# Patient Record
Sex: Female | Born: 1955 | Race: Black or African American | Hispanic: No | Marital: Married | Smoking: Never smoker
Health system: Southern US, Community
[De-identification: ages and names within clinical notes are randomized; demographics above are authoritative.]

## PROBLEM LIST (undated history)

## (undated) DIAGNOSIS — J45909 Unspecified asthma, uncomplicated: Secondary | ICD-10-CM

## (undated) DIAGNOSIS — T7840XA Allergy, unspecified, initial encounter: Secondary | ICD-10-CM

## (undated) DIAGNOSIS — E785 Hyperlipidemia, unspecified: Secondary | ICD-10-CM

## (undated) DIAGNOSIS — D649 Anemia, unspecified: Secondary | ICD-10-CM

## (undated) DIAGNOSIS — H269 Unspecified cataract: Secondary | ICD-10-CM

## (undated) DIAGNOSIS — L309 Dermatitis, unspecified: Secondary | ICD-10-CM

## (undated) DIAGNOSIS — Z889 Allergy status to unspecified drugs, medicaments and biological substances status: Secondary | ICD-10-CM

## (undated) DIAGNOSIS — B019 Varicella without complication: Secondary | ICD-10-CM

## (undated) DIAGNOSIS — T4145XA Adverse effect of unspecified anesthetic, initial encounter: Secondary | ICD-10-CM

## (undated) DIAGNOSIS — G4730 Sleep apnea, unspecified: Secondary | ICD-10-CM

## (undated) HISTORY — DX: Unspecified asthma, uncomplicated: J45.909

## (undated) HISTORY — PX: OTHER SURGICAL HISTORY: SHX169

## (undated) HISTORY — DX: Allergy, unspecified, initial encounter: T78.40XA

## (undated) HISTORY — DX: Varicella without complication: B01.9

## (undated) HISTORY — DX: Sleep apnea, unspecified: G47.30

## (undated) HISTORY — PX: COLONOSCOPY: SHX174

## (undated) HISTORY — DX: Adverse effect of unspecified anesthetic, initial encounter: T41.45XA

## (undated) HISTORY — DX: Hyperlipidemia, unspecified: E78.5

## (undated) HISTORY — PX: EYE SURGERY: SHX253

## (undated) SURGERY — MINOR CAPSULOTOMY
Anesthesia: Choice | Laterality: Left

---
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
## 1994-06-29 HISTORY — PX: MYOMECTOMY: SHX85

## 1998-06-29 DIAGNOSIS — T8859XA Other complications of anesthesia, initial encounter: Secondary | ICD-10-CM

## 1998-06-29 HISTORY — DX: Other complications of anesthesia, initial encounter: T88.59XA

## 2006-12-02 ENCOUNTER — Ambulatory Visit (HOSPITAL_COMMUNITY): Admission: RE | Admit: 2006-12-02 | Discharge: 2006-12-02 | Payer: Self-pay | Admitting: Obstetrics and Gynecology

## 2007-12-05 ENCOUNTER — Ambulatory Visit (HOSPITAL_COMMUNITY): Admission: RE | Admit: 2007-12-05 | Discharge: 2007-12-05 | Payer: Self-pay | Admitting: Obstetrics and Gynecology

## 2008-12-05 ENCOUNTER — Ambulatory Visit (HOSPITAL_COMMUNITY): Admission: RE | Admit: 2008-12-05 | Discharge: 2008-12-05 | Payer: Self-pay | Admitting: Obstetrics and Gynecology

## 2010-01-06 ENCOUNTER — Ambulatory Visit (HOSPITAL_COMMUNITY): Admission: RE | Admit: 2010-01-06 | Discharge: 2010-01-06 | Payer: Self-pay | Admitting: Obstetrics and Gynecology

## 2010-11-25 ENCOUNTER — Other Ambulatory Visit: Payer: Self-pay | Admitting: Nurse Practitioner

## 2010-11-25 ENCOUNTER — Ambulatory Visit
Admission: RE | Admit: 2010-11-25 | Discharge: 2010-11-25 | Disposition: A | Discharge status: Home / Self Care | Payer: BC Managed Care – PPO | Source: Ambulatory Visit | Attending: Nurse Practitioner | Admitting: Nurse Practitioner

## 2010-11-25 DIAGNOSIS — R05 Cough: Secondary | ICD-10-CM

## 2010-12-08 ENCOUNTER — Other Ambulatory Visit (HOSPITAL_COMMUNITY): Payer: Self-pay | Admitting: Obstetrics and Gynecology

## 2010-12-08 DIAGNOSIS — Z1231 Encounter for screening mammogram for malignant neoplasm of breast: Secondary | ICD-10-CM

## 2011-01-08 ENCOUNTER — Ambulatory Visit (HOSPITAL_COMMUNITY)
Admission: RE | Admit: 2011-01-08 | Discharge: 2011-01-08 | Disposition: A | Discharge status: Home / Self Care | Payer: BC Managed Care – PPO | Source: Ambulatory Visit | Attending: Obstetrics and Gynecology | Admitting: Obstetrics and Gynecology

## 2011-01-08 DIAGNOSIS — Z1231 Encounter for screening mammogram for malignant neoplasm of breast: Secondary | ICD-10-CM

## 2011-09-07 DIAGNOSIS — L659 Nonscarring hair loss, unspecified: Secondary | ICD-10-CM | POA: Insufficient documentation

## 2011-09-07 DIAGNOSIS — N951 Menopausal and female climacteric states: Secondary | ICD-10-CM | POA: Insufficient documentation

## 2011-10-06 ENCOUNTER — Ambulatory Visit: Payer: Self-pay | Admitting: Obstetrics and Gynecology

## 2011-10-20 ENCOUNTER — Encounter: Payer: Self-pay | Admitting: Obstetrics and Gynecology

## 2011-10-20 ENCOUNTER — Ambulatory Visit (INDEPENDENT_AMBULATORY_CARE_PROVIDER_SITE_OTHER): Payer: BC Managed Care – PPO | Admitting: Obstetrics and Gynecology

## 2011-10-20 VITALS — BP 118/68 | Resp 16 | Ht 63.75 in | Wt 158.0 lb

## 2011-10-20 DIAGNOSIS — Z01419 Encounter for gynecological examination (general) (routine) without abnormal findings: Secondary | ICD-10-CM

## 2011-10-20 DIAGNOSIS — Z124 Encounter for screening for malignant neoplasm of cervix: Secondary | ICD-10-CM

## 2011-10-20 NOTE — Progress Notes (Signed)
Addended by: Marla Roe A on: 10/20/2011 04:49 PM   Modules accepted: Orders

## 2011-10-20 NOTE — Progress Notes (Addendum)
Contraception none/ PM Last pap 09/2010 wnl Last Mammo 12/2010 wnl Last Colonoscopy 2009 Last Dexa Scan ? If ever had Primary MD Dr. Allyne Gee Abuse at Home none LMP 09/20/2010 P2G1 Melody Comas A  No complaints    Filed Vitals:   10/20/11 1525  BP: 118/68  Resp: 16   Physical Examination: General appearance - alert, well appearing, and in no distress Mental status - alert, oriented to person, place, and time Chest - clear to auscultation, no wheezes, rales or rhonchi, symmetric air entry Heart - normal rate and regular rhythm Abdomen - soft, nontender, nondistended, no masses or organomegaly Breasts - breasts appear normal, no suspicious masses, no skin or nipple changes or axillary nodes Pelvic - normal external genitalia, vulva, vagina, cervix, uterus and adnexa except slightly stenotic (dilated for pap)  A/P Nl Gyn Exam mammo 12/2011

## 2011-10-21 LAB — PAP IG W/ RFLX HPV ASCU

## 2011-11-02 ENCOUNTER — Other Ambulatory Visit: Payer: Self-pay | Admitting: Ophthalmology

## 2011-11-02 NOTE — H&P (View-Only) (Signed)
  Pre-operative History and Physical for Ophthalmic Surgery  Jerica Harvey-Akan 11/02/2011                  Chief Complaint: Decreased vision  Diagnosis: Cataract Left Eye  Allergies  Allergen Reactions  . Darvocet (Propoxyphene-Acetaminophen)   . Hydrocodone   . Penicillins     Prior to Admission medications   Medication Sig Start Date End Date Taking? Authorizing Provider  b complex vitamins capsule Take 1 capsule by mouth daily.    Historical Provider, MD  calcium-vitamin D (OSCAL WITH D) 500-200 MG-UNIT per tablet Take 1 tablet by mouth daily.    Historical Provider, MD    Planned Procedure:                                       Phacoemulsification, Posterior Chamber Intra-ocular Lens Left Eye                                      Acrysof Extend Pc IOL +   There were no vitals filed for this visit.  Pulse: 70         Temp: NE        Resp:  16     ROS:  non-contributory  Past Medical History  Diagnosis Date  . Lupus   . Chicken pox   . Measles   . Mumps   . Low iron   . H/O bladder infections   . Complication of anesthesia 2000    c-section; excessive shivering    Past Surgical History  Procedure Date  . Myomectomy 1998  . Tonsillectomy   . Wisdom tooth extraction   . Adenodectomy   . Cesarean section 2000     History   Social History  . Marital Status: Married    Spouse Name: N/A    Number of Children: N/A  . Years of Education: N/A   Occupational History  . Not on file.   Social History Main Topics  . Smoking status: Never Smoker   . Smokeless tobacco: Not on file  . Alcohol Use: Yes  . Drug Use: No  . Sexually Active: Yes    Birth Control/ Protection: Pill   Other Topics Concern  . Not on file   Social History Narrative  . No narrative on file    The following examination is for anesthesia clearance for minimally invasive Ophthalmic surgery. It is primarily to document heart and lung findings and is not intended to elucidate unknown  general medical conditions inclusive of abdominal masses, lung lesions, etc.   General Constitution:  Within Normal Limits   Alertness/Orientation:  Person, time place     yes   HEENT:  Eye Findings: Cataract                   left eye  Neck: supple without masses  Chest/Lungs: Normal S1 and S2 without Murmur, S3 or S4  Cardiac: Normal S1 and S2 without Murmur, S3 or S4  Neuro: non-focal  Impression: Cataract Left Eye  Planned Procedure: Phacoemulsification, Posterior Chamber Intraocular Lens Left Eye    Quay Simkin, MD        

## 2011-11-02 NOTE — H&P (Addendum)
  Pre-operative History and Physical for Ophthalmic Surgery  Maria Kline 11/02/2011                  Chief Complaint: Decreased vision  Diagnosis: Cataract Left Eye  Allergies  Allergen Reactions  . Darvocet (Propoxyphene-Acetaminophen)   . Hydrocodone   . Penicillins     Prior to Admission medications   Medication Sig Start Date End Date Taking? Authorizing Provider  b complex vitamins capsule Take 1 capsule by mouth daily.    Historical Provider, MD  calcium-vitamin D (OSCAL WITH D) 500-200 MG-UNIT per tablet Take 1 tablet by mouth daily.    Historical Provider, MD    Planned Procedure:                                       Phacoemulsification, Posterior Chamber Intra-ocular Lens Left Eye                                      Acrysof Extend Pc IOL +   There were no vitals filed for this visit.  Pulse: 70         Temp: NE        Resp:  16     ROS:  non-contributory  Past Medical History  Diagnosis Date  . Lupus   . Chicken pox   . Measles   . Mumps   . Low iron   . H/O bladder infections   . Complication of anesthesia 2000    c-section; excessive shivering    Past Surgical History  Procedure Date  . Myomectomy 1998  . Tonsillectomy   . Wisdom tooth extraction   . Adenodectomy   . Cesarean section 2000     History   Social History  . Marital Status: Married    Spouse Name: N/A    Number of Children: N/A  . Years of Education: N/A   Occupational History  . Not on file.   Social History Main Topics  . Smoking status: Never Smoker   . Smokeless tobacco: Not on file  . Alcohol Use: Yes  . Drug Use: No  . Sexually Active: Yes    Birth Control/ Protection: Pill   Other Topics Concern  . Not on file   Social History Narrative  . No narrative on file    The following examination is for anesthesia clearance for minimally invasive Ophthalmic surgery. It is primarily to document heart and lung findings and is not intended to elucidate unknown  general medical conditions inclusive of abdominal masses, lung lesions, etc.   General Constitution:  Within Normal Limits   Alertness/Orientation:  Person, time place     yes   HEENT:  Eye Findings: Cataract                   left eye  Neck: supple without masses  Chest/Lungs: Normal S1 and S2 without Murmur, S3 or S4  Cardiac: Normal S1 and S2 without Murmur, S3 or S4  Neuro: non-focal  Impression: Cataract Left Eye  Planned Procedure: Phacoemulsification, Posterior Chamber Intraocular Lens Left Eye    Shade Flood, MD

## 2011-11-02 NOTE — Interval H&P Note (Signed)
Acrysof Extend  + 3.50  Diopter PC IOL for implant OS  Jenica Costilow

## 2011-11-10 ENCOUNTER — Encounter (HOSPITAL_COMMUNITY): Payer: Self-pay | Admitting: Pharmacy Technician

## 2011-11-12 ENCOUNTER — Encounter (HOSPITAL_COMMUNITY): Payer: Self-pay

## 2011-11-12 ENCOUNTER — Encounter (HOSPITAL_COMMUNITY)
Admission: RE | Admit: 2011-11-12 | Discharge: 2011-11-12 | Disposition: A | Discharge status: Home / Self Care | Payer: BC Managed Care – PPO | Source: Ambulatory Visit | Attending: Ophthalmology | Admitting: Ophthalmology

## 2011-11-12 HISTORY — DX: Anemia, unspecified: D64.9

## 2011-11-12 HISTORY — DX: Unspecified cataract: H26.9

## 2011-11-12 HISTORY — DX: Allergy status to unspecified drugs, medicaments and biological substances: Z88.9

## 2011-11-12 HISTORY — DX: Dermatitis, unspecified: L30.9

## 2011-11-12 LAB — CBC
HCT: 38.7 % (ref 36.0–46.0)
Hemoglobin: 12.9 g/dL (ref 12.0–15.0)
MCV: 88.8 fL (ref 78.0–100.0)

## 2011-11-12 NOTE — Pre-Procedure Instructions (Signed)
20 Maria Kline  11/12/2011   Your procedure is scheduled on:  Wed, May 29 @ 1:10 PM  Report to [facility] at 11:00 AM.  Call this number if you have problems the morning of surgery: [phone]   Remember:   Do not eat food:After Midnight.  May have clear liquids: up to 4 Hours before arrival(until 7:00 AM)  Clear liquids include soda, tea, black coffee, apple or grape juice, broth,water   Do not wear jewelry, make-up or nail polish.  Do not wear lotions, powders, or perfumes.   Do not shave 48 hours prior to surgery.   Do not bring valuables to the hospital.  Contacts, dentures or bridgework may not be worn into surgery.  Leave suitcase in the car. After surgery it may be brought to your room.  For patients admitted to the hospital, checkout time is 11:00 AM the day of discharge.   Patients discharged the day of surgery will not be allowed to drive home.  Special Instructions: CHG Shower Use Special Wash: 1/2 bottle night before surgery and 1/2 bottle morning of surgery.   Please read over the following fact sheets that you were given: Pain Booklet, Coughing and Deep Breathing and Surgical Site Infection Prevention

## 2011-11-12 NOTE — Progress Notes (Signed)
Pt doesn't have a cardiologist  Denies ever having an echo/stress test/heart cath  Medical MD is Dr.Robin Allyne Gee with [facility]

## 2011-11-24 MED ORDER — PREDNISOLONE ACETATE 1 % OP SUSP
1.0000 [drp] | OPHTHALMIC | Status: AC
Start: 2011-11-25 — End: 2011-11-25
  Administered 2011-11-25: 1 [drp] via OPHTHALMIC
  Filled 2011-11-24: qty 5

## 2011-11-24 MED ORDER — TETRACAINE HCL 0.5 % OP SOLN
2.0000 [drp] | OPHTHALMIC | Status: AC
  Administered 2011-11-25: 2 [drp] via OPHTHALMIC
  Filled 2011-11-24: qty 2

## 2011-11-24 MED ORDER — GATIFLOXACIN 0.5 % OP SOLN
1.0000 [drp] | OPHTHALMIC | Status: AC | PRN
  Administered 2011-11-25 (×3): 1 [drp] via OPHTHALMIC
  Filled 2011-11-24: qty 2.5

## 2011-11-24 MED ORDER — PHENYLEPHRINE HCL 2.5 % OP SOLN
1.0000 [drp] | OPHTHALMIC | Status: AC | PRN
  Administered 2011-11-25 (×3): 1 [drp] via OPHTHALMIC
  Filled 2011-11-24: qty 3

## 2011-11-25 ENCOUNTER — Encounter (HOSPITAL_COMMUNITY): Payer: Self-pay | Admitting: Anesthesiology

## 2011-11-25 ENCOUNTER — Encounter (HOSPITAL_COMMUNITY)
Admission: RE | Disposition: A | Discharge status: Home / Self Care | Payer: Self-pay | Source: Ambulatory Visit | Attending: Ophthalmology

## 2011-11-25 ENCOUNTER — Ambulatory Visit (HOSPITAL_COMMUNITY)
Admission: RE | Admit: 2011-11-25 | Discharge: 2011-11-25 | Disposition: A | Discharge status: Home / Self Care | Payer: BC Managed Care – PPO | Source: Ambulatory Visit | Attending: Ophthalmology | Admitting: Ophthalmology

## 2011-11-25 ENCOUNTER — Ambulatory Visit (HOSPITAL_COMMUNITY): Payer: BC Managed Care – PPO | Admitting: Anesthesiology

## 2011-11-25 ENCOUNTER — Encounter (HOSPITAL_COMMUNITY): Payer: Self-pay

## 2011-11-25 DIAGNOSIS — H269 Unspecified cataract: Secondary | ICD-10-CM | POA: Insufficient documentation

## 2011-11-25 DIAGNOSIS — Z01812 Encounter for preprocedural laboratory examination: Secondary | ICD-10-CM | POA: Insufficient documentation

## 2011-11-25 HISTORY — PX: CATARACT EXTRACTION W/PHACO: SHX586

## 2011-11-25 SURGERY — PHACOEMULSIFICATION, CATARACT, WITH IOL INSERTION
Anesthesia: Monitor Anesthesia Care | Site: Eye | Laterality: Left | Wound class: Clean

## 2011-11-25 MED ORDER — WATER FOR IRRIGATION, STERILE IR SOLN
Status: DC | PRN
  Administered 2011-11-25: 1000 mL via OPHTHALMIC

## 2011-11-25 MED ORDER — SODIUM CHLORIDE 0.9 % IV SOLN
INTRAVENOUS | Status: DC | PRN
  Administered 2011-11-25: 13:00:00 via INTRAVENOUS

## 2011-11-25 MED ORDER — DEXAMETHASONE SODIUM PHOSPHATE 10 MG/ML IJ SOLN
INTRAMUSCULAR | Status: DC | PRN
  Administered 2011-11-25: 10 mg

## 2011-11-25 MED ORDER — BUPIVACAINE HCL 0.75 % IJ SOLN
INTRAMUSCULAR | Status: DC | PRN
  Administered 2011-11-25: 10 mL

## 2011-11-25 MED ORDER — EPINEPHRINE HCL 1 MG/ML IJ SOLN
INTRAOCULAR | Status: DC | PRN
  Administered 2011-11-25: 13:00:00

## 2011-11-25 MED ORDER — CEFAZOLIN SUBCONJUNCTIVAL INJECTION 100 MG/0.5 ML
200.0000 mg | INJECTION | SUBCONJUNCTIVAL | Status: DC
  Filled 2011-11-25: qty 1

## 2011-11-25 MED ORDER — EPINEPHRINE HCL 1 MG/ML IJ SOLN: INTRAOCULAR | Status: DC | PRN

## 2011-11-25 MED ORDER — BACITRACIN-POLYMYXIN B 500-10000 UNIT/GM OP OINT
TOPICAL_OINTMENT | OPHTHALMIC | Status: DC | PRN
  Administered 2011-11-25: 1 via OPHTHALMIC

## 2011-11-25 MED ORDER — ONDANSETRON HCL 4 MG/2ML IJ SOLN
INTRAMUSCULAR | Status: DC | PRN
  Administered 2011-11-25: 4 mg via INTRAVENOUS

## 2011-11-25 MED ORDER — PROVISC 10 MG/ML IO SOLN
INTRAOCULAR | Status: DC | PRN
  Administered 2011-11-25: .85 mL via INTRAOCULAR

## 2011-11-25 MED ORDER — NA CHONDROIT SULF-NA HYALURON 40-30 MG/ML IO SOLN
INTRAOCULAR | Status: DC | PRN
  Administered 2011-11-25: 0.5 mL via INTRAOCULAR

## 2011-11-25 MED ORDER — LIDOCAINE HCL (PF) 2 % IJ SOLN
INTRAMUSCULAR | Status: DC | PRN
  Administered 2011-11-25: 10 mL

## 2011-11-25 MED ORDER — PROPOFOL 10 MG/ML IV EMUL
INTRAVENOUS | Status: DC | PRN
  Administered 2011-11-25: 30 mg via INTRAVENOUS

## 2011-11-25 MED ORDER — MIDAZOLAM HCL 5 MG/5ML IJ SOLN
INTRAMUSCULAR | Status: DC | PRN
  Administered 2011-11-25 (×2): 1 mg via INTRAVENOUS

## 2011-11-25 MED ORDER — HYPROMELLOSE (GONIOSCOPIC) 2.5 % OP SOLN
OPHTHALMIC | Status: DC | PRN
  Administered 2011-11-25: 1 [drp] via OPHTHALMIC

## 2011-11-25 MED ORDER — FENTANYL CITRATE 0.05 MG/ML IJ SOLN
INTRAMUSCULAR | Status: DC | PRN
  Administered 2011-11-25: 50 ug via INTRAVENOUS

## 2011-11-25 MED ORDER — LIDOCAINE HCL (CARDIAC) 10 MG/ML IV SOLN
INTRAVENOUS | Status: DC | PRN
  Administered 2011-11-25: 50 mg via INTRAVENOUS

## 2011-11-25 MED ORDER — VANCOMYCIN SUBCONJUNCTIVAL INJECTION 25 MG/0.5 ML
50.0000 mg | INTRAOCULAR | Status: AC
  Administered 2011-11-25: 50 mg via SUBCONJUNCTIVAL
  Filled 2011-11-25: qty 1

## 2011-11-25 SURGICAL SUPPLY — 56 items
APPLICATOR COTTON TIP 6IN STRL (MISCELLANEOUS) ×2 IMPLANT
APPLICATOR DR MATTHEWS STRL (MISCELLANEOUS) ×2 IMPLANT
BAG MINI COLL DRAIN (WOUND CARE) ×2 IMPLANT
BLADE EYE MINI 60D BEAVER (BLADE) IMPLANT
BLADE KERATOME 2.75 (BLADE) ×2 IMPLANT
BLADE STAB KNIFE 15DEG (BLADE) IMPLANT
CANNULA ANTERIOR CHAMBER 27GA (MISCELLANEOUS) IMPLANT
CLOTH BEACON ORANGE TIMEOUT ST (SAFETY) ×2 IMPLANT
DRAPE OPHTHALMIC 77X100 STRL (CUSTOM PROCEDURE TRAY) ×2 IMPLANT
DRAPE POUCH INSTRU U-SHP 10X18 (DRAPES) ×2 IMPLANT
DRSG TEGADERM 4X4.75 (GAUZE/BANDAGES/DRESSINGS) ×2 IMPLANT
FILTER BLUE MILLIPORE (MISCELLANEOUS) IMPLANT
GLOVE SS BIOGEL STRL SZ 6.5 (GLOVE) ×1 IMPLANT
GLOVE SUPERSENSE BIOGEL SZ 6.5 (GLOVE) ×1
GOWN SRG XL XLNG 56XLVL 4 (GOWN DISPOSABLE) IMPLANT
GOWN STRL NON-REIN LRG LVL3 (GOWN DISPOSABLE) IMPLANT
GOWN STRL NON-REIN XL XLG LVL4 (GOWN DISPOSABLE)
Intraocular Lens ×2 IMPLANT
KIT BASIN OR (CUSTOM PROCEDURE TRAY) ×2 IMPLANT
KIT ROOM TURNOVER OR (KITS) IMPLANT
KNIFE GRIESHABER SHARP 2.5MM (MISCELLANEOUS) ×2 IMPLANT
MASK EYE SHIELD (GAUZE/BANDAGES/DRESSINGS) ×2 IMPLANT
NEEDLE 18GX1X1/2 (RX/OR ONLY) (NEEDLE) IMPLANT
NEEDLE 22X1 1/2 (OR ONLY) (NEEDLE) ×2 IMPLANT
NEEDLE 25GX 5/8IN NON SAFETY (NEEDLE) ×2 IMPLANT
NEEDLE FILTER BLUNT 18X 1/2SAF (NEEDLE)
NEEDLE FILTER BLUNT 18X1 1/2 (NEEDLE) IMPLANT
NEEDLE HYPO 30X.5 LL (NEEDLE) ×4 IMPLANT
NS IRRIG 1000ML POUR BTL (IV SOLUTION) ×2 IMPLANT
PACK CATARACT CUSTOM (CUSTOM PROCEDURE TRAY) ×2 IMPLANT
PACK CATARACT MCHSCP (PACKS) ×2 IMPLANT
PACK COMBINED CATERACT/VIT 23G (OPHTHALMIC RELATED) IMPLANT
PAD ARMBOARD 7.5X6 YLW CONV (MISCELLANEOUS) ×2 IMPLANT
PAD EYE OVAL STERILE LF (GAUZE/BANDAGES/DRESSINGS) ×2 IMPLANT
PHACO TIP KELMAN 45DEG (TIP) ×2 IMPLANT
PROBE ANTERIOR 20G W/INFUS NDL (MISCELLANEOUS) IMPLANT
ROLLS DENTAL (MISCELLANEOUS) IMPLANT
SHUTTLE MONARCH TYPE A (NEEDLE) ×2 IMPLANT
SOLUTION ANTI FOG 6CC (MISCELLANEOUS) ×2 IMPLANT
SPEAR EYE SURG WECK-CEL (MISCELLANEOUS) ×2 IMPLANT
SUT ETHILON 10-0 CS-B-6CS-B-6 (SUTURE)
SUT ETHILON 5 0 P 3 18 (SUTURE)
SUT ETHILON 9 0 TG140 8 (SUTURE) IMPLANT
SUT NYLON ETHILON 5-0 P-3 1X18 (SUTURE) IMPLANT
SUT PLAIN 6 0 TG1408 (SUTURE) IMPLANT
SUT POLY NON ABSORB 10-0 8 STR (SUTURE) IMPLANT
SUT VICRYL 6 0 S 29 12 (SUTURE) IMPLANT
SUTURE EHLN 10-0 CS-B-6CS-B-6 (SUTURE) IMPLANT
SYR 20CC LL (SYRINGE) IMPLANT
SYR 5ML LL (SYRINGE) IMPLANT
SYR TB 1ML LUER SLIP (SYRINGE) ×2 IMPLANT
SYRINGE 10CC LL (SYRINGE) IMPLANT
TAPE PAPER MEDFIX 1IN X 10YD (GAUZE/BANDAGES/DRESSINGS) ×2 IMPLANT
TOWEL OR 17X24 6PK STRL BLUE (TOWEL DISPOSABLE) ×4 IMPLANT
WATER STERILE IRR 1000ML POUR (IV SOLUTION) ×2 IMPLANT
WIPE INSTRUMENT VISIWIPE 73X73 (MISCELLANEOUS) ×2 IMPLANT

## 2011-11-25 NOTE — Preoperative (Signed)
Beta Blockers   Reason not to administer Beta Blockers:Not Applicable 

## 2011-11-25 NOTE — Anesthesia Procedure Notes (Signed)
Procedure Name: MAC Date/Time: 11/25/2011 1:19 PM Performed by: Lovie Chol Pre-anesthesia Checklist: Patient identified, Emergency Drugs available, Suction available, Patient being monitored and Timeout performed Patient Re-evaluated:Patient Re-evaluated prior to inductionOxygen Delivery Method: Nasal cannula Preoxygenation: Pre-oxygenation with 100% oxygen Intubation Type: IV induction Placement Confirmation: positive ETCO2

## 2011-11-25 NOTE — Interval H&P Note (Signed)
History and Physical Interval Note:  11/25/2011 12:36 PM  Maria Kline  has presented today for surgery, with the diagnosis of Nuclear Cataract  The various methods of treatment have been discussed with the patient and family. After consideration of risks, benefits and other options for treatment, the patient has consented to  Procedure(s) (LRB): CATARACT EXTRACTION PHACO AND INTRAOCULAR LENS PLACEMENT (IOC) (Left) as a surgical intervention .  The patients' history has been reviewed, patient examined, no change in status, stable for surgery.  I have reviewed the patients' chart and labs.  Questions were answered to the patient's satisfaction.     Shade Flood, MD

## 2011-11-25 NOTE — Anesthesia Preprocedure Evaluation (Addendum)
Anesthesia Evaluation  Patient identified by MRN, date of birth, ID band Patient awake    Reviewed: Allergy & Precautions, H&P , NPO status , Patient's Chart, lab work & pertinent test results  History of Anesthesia Complications Negative for: history of anesthetic complications  Airway Mallampati: II TM Distance: >3 FB Neck ROM: Full    Dental  (+) Teeth Intact, Caps and Dental Advisory Given   Pulmonary  breath sounds clear to auscultation        Cardiovascular Rhythm:Regular Rate:Normal     Neuro/Psych    GI/Hepatic   Endo/Other    Renal/GU      Musculoskeletal   Abdominal   Peds  Hematology   Anesthesia Other Findings   Reproductive/Obstetrics                          Anesthesia Physical Anesthesia Plan  ASA: II  Anesthesia Plan: MAC   Post-op Pain Management:    Induction: Intravenous  Airway Management Planned: Nasal Cannula  Additional Equipment:   Intra-op Plan:   Post-operative Plan:   Informed Consent: I have reviewed the patients History and Physical, chart, labs and discussed the procedure including the risks, benefits and alternatives for the proposed anesthesia with the patient or authorized representative who has indicated his/her understanding and acceptance.   Dental advisory given  Plan Discussed with:   Anesthesia Plan Comments: (Plan MAC  Kipp Brood, MD)        Anesthesia Quick Evaluation

## 2011-11-25 NOTE — Transfer of Care (Signed)
Immediate Anesthesia Transfer of Care Note  Patient: Maria Kline  Procedure(s) Performed: Procedure(s) (LRB): CATARACT EXTRACTION PHACO AND INTRAOCULAR LENS PLACEMENT (IOC) (Left)  Patient Location: Short Stay  Anesthesia Type: MAC  Level of Consciousness: awake, alert  and oriented  Airway & Oxygen Therapy: Patient Spontanous Breathing  Post-op Assessment: Report given to PACU RN  Post vital signs: Reviewed  Complications: No apparent anesthesia complications

## 2011-11-25 NOTE — Discharge Instructions (Signed)
Zaelyn Noack      11/25/2011  Post-operative instructions for Sagar Tengan L. Clarisa Kindred, MD  Caring for your eye:  Do not rub your eye and wash your hands before touching the eye area. This is important to avoid injury and infection.  You may use sterile gauze pads and sterile eye wash to cleanse the lid margins of mucous accumulation.  DO NOT REUSE GAUZE after wiping the eye. Use a new clean one if needed.  Be certain not to touch the top of the medication bottle to the eyelids to avoid contaminating your medicine bottle and causing infection.  After eye surgery the surface of the eye and eyelids may be puffy. You may note a red blotch(s) on the surface of the eye or a bruise on your eyelid. These are usually related to injections or instruments used in surgery and are not cause for alarm. You may also notice blood tinged tears on your eye pad, this is common and not cause for alarm. These findings will subside over the coming week or two.  Activity:  No jarring activities. Walking with assistance early on as needed is advised. Avoid straining and let me know if you have significant constipation. Do not bend over at the waist with the head below your waist to minimize risk of bleeding inside the eye.  Avoid getting water from washing your hair or showering  in your eye. Patch the eye if necessary during bathing to avoid contamination.    You may: watch television, work on you computer, read books, eat out, ride in a car.  Sleeping Position:    You do not have a gas bubble in your eye, you may sleep in your         customary manner.  Wear your eye shield for naps and sleeping at night for the first two weeks.   Wait a few minutes between your eye drops when placing them.   Resume your customary medications on your normal schedule. Shade Flood MD    After office hours I can be reached by calling: [phone]                                                                     or    [phone]

## 2011-11-25 NOTE — Anesthesia Postprocedure Evaluation (Signed)
  Anesthesia Post-op Note  Patient: Maria Kline  Procedure(s) Performed: Procedure(s) (LRB): CATARACT EXTRACTION PHACO AND INTRAOCULAR LENS PLACEMENT (IOC) (Left)  Patient Location: Short Stay  Anesthesia Type: MAC  Level of Consciousness: awake  Airway and Oxygen Therapy: Patient Spontanous Breathing  Post-op Pain: none  Post-op Assessment: Post-op Vital signs reviewed, Patient's Cardiovascular Status Stable, Respiratory Function Stable, Patent Airway and No signs of Nausea or vomiting  Post-op Vital Signs: Reviewed  Complications: No apparent anesthesia complications

## 2011-11-25 NOTE — Op Note (Signed)
Maria Kline 11/25/2011 Cataract  Procedure: Phacoemulsification, Posterior Chamber Intra-ocular Lens Operative Eye:  Left Eye Surgeon: Shade Flood Estimated Blood Loss: minimal Specimens for Pathology:  None Complications: none  The patient was prepared and draped in the usual manner for ocular surgery on the left eye. A Cook lid speculum was placed. A peripheral clear corneal incision was made at the surgical limbus centered at the 11:00 meridian. A separate clear corneal stab incision was made with a 15 degree blade at the 2:00 meridian to permit bi-manual technique. Provisc was instilled into the anterior chamber through that incision.  A keratome was used to create a self sealing incision entering the anterior chamber at the 11:00 meridian. A capsulorhexis was performed using a bent 25g needle. The lens was hydrodissected and the nucleus was hydrodilineated using a Nichammin cannula. The Chang chopper was inserted and used to rotate the lens to insure adequate lens mobility. The phacoemulsification handpiece was inserted and a combined phaco-chop technique was employed, fracturing the lens into separate sections with subsequent removal with the phaco handpiece.   The I/A cannula was used to remove remaining lens cortex. Provisc was instilled and used to deepen the anterior chamber and posterior capsule bag. The Monarch injector was used to place a folded Acrysof Expand PC IOL, + 4.00  diopters, into the capsule bag. A Sinskey lens hook was used to dial in the trailing haptic.  The I/A cannula was used to remove the viscoelastic from the anterior chamber. BSS was used to bring IOP to the desired range and the wound was checked to insure it was watertight. Subconjunctival injections of Ance 100/0.79ml and Dexamethasone 4mg /35ml were placed without complication. The lid speculum and drapes were removed and the patient's eye was patched with Polymixin/Bacitracin ophthalmic ointment. An eye shield  was placed and the patient was transferred alert and conversant from the operating room to the post-operative recovery area.   Shade Flood, MD

## 2011-12-01 ENCOUNTER — Encounter (HOSPITAL_COMMUNITY): Payer: Self-pay | Admitting: Ophthalmology

## 2011-12-02 ENCOUNTER — Other Ambulatory Visit: Payer: Self-pay | Admitting: Obstetrics and Gynecology

## 2011-12-02 DIAGNOSIS — Z1231 Encounter for screening mammogram for malignant neoplasm of breast: Secondary | ICD-10-CM

## 2011-12-03 ENCOUNTER — Other Ambulatory Visit: Payer: Self-pay | Admitting: Ophthalmology

## 2011-12-03 NOTE — H&P (Signed)
Pre-operative History and Physical for Ophthalmic Surgery  Maria Kline 12/03/2011                  Chief Complaint: Decreased vision  Diagnosis: Cataract Right Eye  Allergies  Allergen Reactions  . Darvocet (Propoxyphene-Acetaminophen)     Patient states that she is not allergic to this medication  . Demerol (Meperidine) Itching  . Hydrocodone     Patient states that she is not allergic to this medication  . Penicillins Other (See Comments)    Childhood reaction     Prior to Admission medications   Medication Sig Start Date End Date Taking? Authorizing Provider  b complex vitamins capsule Take 1 capsule by mouth daily.    Historical Provider, MD  beta carotene w/minerals (OCUVITE) tablet Take 1 tablet by mouth daily.    Historical Provider, MD  lactobacillus acidophilus (BACID) TABS Take 2 tablets by mouth 3 (three) times daily.    Historical Provider, MD  Polyethyl Glycol-Propyl Glycol (SYSTANE) 0.4-0.3 % GEL Place 1 application into both eyes 4 (four) times daily as needed. For dry eyes    Historical Provider, MD  Vitamin D, Ergocalciferol, (DRISDOL) 50000 UNITS CAPS Take 50,000 Units by mouth 2 (two) times a week. Tuesdays and fridays    Historical Provider, MD    Planned Procedure:                                       Phacoemulsification, Posterior Chamber Intra-ocular Lens Right Eye                                      Acrysof Expand Series PC IOL  +3.00 for implant OD  There were no vitals filed for this visit.  Pulse: 70         Temp: NE        Resp:  16      ROS: negative  Past Medical History  Diagnosis Date  . Chicken pox   . Measles   . Mumps   . Complication of anesthesia 2000    c-section; excessive shivering  . Hx of seasonal allergies   . Vitamin d deficiency     Vit D on tues and fri  . Eczema     hx of  . Anemia     30yrs ago  . Cataracts, bilateral     Past Surgical History  Procedure Date  . Myomectomy 1996  . Wisdom tooth  extraction   . Adenodectomy   . Cesarean section 2000  . Tonsillectomy     as a child  . Colonoscopy   . Cataract extraction w/phaco 11/25/2011    Procedure: CATARACT EXTRACTION PHACO AND INTRAOCULAR LENS PLACEMENT (IOC);  Surgeon: Denita Lun, MD;  Location: [facility] OR;  Service: Ophthalmology;  Laterality: Left;     History   Social History  . Marital Status: Married    Spouse Name: N/A    Number of Children: N/A  . Years of Education: N/A   Occupational History  . Not on file.   Social History Main Topics  . Smoking status: Never Smoker   . Smokeless tobacco: Not on file  . Alcohol Use: Yes     socially  . Drug Use: No  . Sexually Active: Yes    Birth Control/ Protection:   Pill   Other Topics Concern  . Not on file   Social History Narrative  . No narrative on file     The following examination is for anesthesia clearance for minimally invasive Ophthalmic surgery. It is primarily to document heart and lung findings and is not intended to elucidate unknown general medical conditions inclusive of abdominal masses, lung lesions, etc.   General Constitution:  Within Normal Limits   Alertness/Orientation:  Person, time place     yes   HEENT:  Eye Findings: Cataract                   right eye  Neck: supple without masses  Chest/Lungs: clear to auscultation  Cardiac: Normal S1 and S2 without Murmur, S3 or S4  Neuro: non-focal   Impression: Cataract Right Eye  Planned Procedure:  Phacoemulsification, Posterior Chamber Intraocular Lens     Kaleb Sek, MD        

## 2011-12-04 ENCOUNTER — Encounter (HOSPITAL_COMMUNITY): Payer: Self-pay | Admitting: Pharmacy Technician

## 2011-12-04 ENCOUNTER — Encounter (HOSPITAL_COMMUNITY): Payer: Self-pay | Admitting: *Deleted

## 2011-12-07 ENCOUNTER — Encounter (HOSPITAL_COMMUNITY): Payer: Self-pay | Admitting: Anesthesiology

## 2011-12-07 ENCOUNTER — Ambulatory Visit (HOSPITAL_COMMUNITY): Payer: BC Managed Care – PPO | Admitting: Anesthesiology

## 2011-12-07 ENCOUNTER — Ambulatory Visit (HOSPITAL_COMMUNITY)
Admission: RE | Admit: 2011-12-07 | Discharge: 2011-12-07 | Disposition: A | Discharge status: Home / Self Care | Payer: BC Managed Care – PPO | Source: Ambulatory Visit | Attending: Ophthalmology | Admitting: Ophthalmology

## 2011-12-07 ENCOUNTER — Encounter (HOSPITAL_COMMUNITY)
Admission: RE | Disposition: A | Discharge status: Home / Self Care | Payer: Self-pay | Source: Ambulatory Visit | Attending: Ophthalmology

## 2011-12-07 ENCOUNTER — Encounter (HOSPITAL_COMMUNITY): Payer: Self-pay | Admitting: *Deleted

## 2011-12-07 DIAGNOSIS — H269 Unspecified cataract: Secondary | ICD-10-CM | POA: Insufficient documentation

## 2011-12-07 HISTORY — PX: CATARACT EXTRACTION W/PHACO: SHX586

## 2011-12-07 LAB — CBC
HCT: 39.5 % (ref 36.0–46.0)
Hemoglobin: 13.3 g/dL (ref 12.0–15.0)
MCV: 87.6 fL (ref 78.0–100.0)
RBC: 4.51 MIL/uL (ref 3.87–5.11)
RDW: 13.1 % (ref 11.5–15.5)
WBC: 3.9 10*3/uL — ABNORMAL LOW (ref 4.0–10.5)

## 2011-12-07 SURGERY — PHACOEMULSIFICATION, CATARACT, WITH IOL INSERTION
Anesthesia: Monitor Anesthesia Care | Site: Eye | Laterality: Right | Wound class: Clean

## 2011-12-07 MED ORDER — WATER FOR IRRIGATION, STERILE IR SOLN
Status: DC | PRN
  Administered 2011-12-07: 1000 mL via OPHTHALMIC

## 2011-12-07 MED ORDER — TETRACAINE HCL 0.5 % OP SOLN
OPHTHALMIC | Status: AC
  Administered 2011-12-07: 2 [drp] via OPHTHALMIC
  Filled 2011-12-07: qty 2

## 2011-12-07 MED ORDER — PROPOFOL 10 MG/ML IV BOLUS
INTRAVENOUS | Status: DC | PRN
  Administered 2011-12-07: 60 mg via INTRAVENOUS

## 2011-12-07 MED ORDER — DEXAMETHASONE SODIUM PHOSPHATE 10 MG/ML IJ SOLN
INTRAMUSCULAR | Status: DC | PRN
  Administered 2011-12-07: 10 mg via INTRAVENOUS

## 2011-12-07 MED ORDER — BALANCED SALT IO SOLN
INTRAOCULAR | Status: DC | PRN
  Administered 2011-12-07: 500 mL via INTRAOCULAR

## 2011-12-07 MED ORDER — NA CHONDROIT SULF-NA HYALURON 40-30 MG/ML IO SOLN
INTRAOCULAR | Status: DC | PRN
  Administered 2011-12-07: 0.5 mL via INTRAOCULAR

## 2011-12-07 MED ORDER — PHENYLEPHRINE HCL 2.5 % OP SOLN
OPHTHALMIC | Status: AC
  Administered 2011-12-07: 1 [drp] via OPHTHALMIC
  Filled 2011-12-07: qty 3

## 2011-12-07 MED ORDER — PHENYLEPHRINE HCL 2.5 % OP SOLN
1.0000 [drp] | OPHTHALMIC | Status: AC | PRN
  Administered 2011-12-07 (×3): 1 [drp] via OPHTHALMIC

## 2011-12-07 MED ORDER — HYPROMELLOSE (GONIOSCOPIC) 2.5 % OP SOLN
OPHTHALMIC | Status: DC | PRN
  Administered 2011-12-07: 2 [drp] via OPHTHALMIC

## 2011-12-07 MED ORDER — BUPIVACAINE HCL 0.75 % IJ SOLN
INTRAMUSCULAR | Status: DC | PRN
  Administered 2011-12-07: 20 mL

## 2011-12-07 MED ORDER — TETRACAINE HCL 0.5 % OP SOLN
2.0000 [drp] | OPHTHALMIC | Status: AC
  Administered 2011-12-07: 2 [drp] via OPHTHALMIC

## 2011-12-07 MED ORDER — PREDNISOLONE ACETATE 1 % OP SUSP
OPHTHALMIC | Status: AC
  Administered 2011-12-07: 1 [drp] via OPHTHALMIC
  Filled 2011-12-07: qty 5

## 2011-12-07 MED ORDER — LIDOCAINE HCL (CARDIAC) 20 MG/ML IV SOLN
INTRAVENOUS | Status: DC | PRN
  Administered 2011-12-07: 20 mg via INTRAVENOUS

## 2011-12-07 MED ORDER — MIDAZOLAM HCL 5 MG/5ML IJ SOLN
INTRAMUSCULAR | Status: DC | PRN
  Administered 2011-12-07: 1 mg via INTRAVENOUS

## 2011-12-07 MED ORDER — VANCOMYCIN SUBCONJUNCTIVAL INJECTION 25 MG/0.5 ML
50.0000 mg | Freq: Once | INTRAOCULAR | Status: AC
  Administered 2011-12-07: 50 mg via SUBCONJUNCTIVAL
  Filled 2011-12-07: qty 1

## 2011-12-07 MED ORDER — EPINEPHRINE HCL 1 MG/ML IJ SOLN
INTRAMUSCULAR | Status: DC | PRN
  Administered 2011-12-07: 1 mg

## 2011-12-07 MED ORDER — SODIUM CHLORIDE 0.9 % IV SOLN
INTRAVENOUS | Status: DC | PRN
  Administered 2011-12-07 (×2): via INTRAVENOUS

## 2011-12-07 MED ORDER — LIDOCAINE HCL (PF) 2 % IJ SOLN
INTRAMUSCULAR | Status: DC | PRN
  Administered 2011-12-07: 10 mL

## 2011-12-07 MED ORDER — GATIFLOXACIN 0.5 % OP SOLN
OPHTHALMIC | Status: AC
  Administered 2011-12-07: 1 [drp] via OPHTHALMIC
  Filled 2011-12-07: qty 2.5

## 2011-12-07 MED ORDER — PROVISC 10 MG/ML IO SOLN
INTRAOCULAR | Status: DC | PRN
  Administered 2011-12-07: .85 mL via INTRAOCULAR

## 2011-12-07 MED ORDER — GATIFLOXACIN 0.5 % OP SOLN
1.0000 [drp] | OPHTHALMIC | Status: AC | PRN
  Administered 2011-12-07 (×3): 1 [drp] via OPHTHALMIC

## 2011-12-07 MED ORDER — 0.9 % SODIUM CHLORIDE (POUR BTL) OPTIME
TOPICAL | Status: DC | PRN
  Administered 2011-12-07: 1000 mL

## 2011-12-07 MED ORDER — PREDNISOLONE ACETATE 1 % OP SUSP
1.0000 [drp] | OPHTHALMIC | Status: AC
  Administered 2011-12-07: 1 [drp] via OPHTHALMIC

## 2011-12-07 SURGICAL SUPPLY — 58 items
APPLICATOR COTTON TIP 6IN STRL (MISCELLANEOUS) ×2 IMPLANT
APPLICATOR DR MATTHEWS STRL (MISCELLANEOUS) ×2 IMPLANT
BAG MINI COLL DRAIN (WOUND CARE) ×2 IMPLANT
BLADE EYE MINI 60D BEAVER (BLADE) IMPLANT
BLADE KERATOME 2.75 (BLADE) ×2 IMPLANT
BLADE STAB KNIFE 15DEG (BLADE) IMPLANT
CANNULA ANTERIOR CHAMBER 27GA (MISCELLANEOUS) IMPLANT
CLOTH BEACON ORANGE TIMEOUT ST (SAFETY) ×2 IMPLANT
DRAPE OPHTHALMIC 77X100 STRL (CUSTOM PROCEDURE TRAY) ×2 IMPLANT
DRAPE POUCH INSTRU U-SHP 10X18 (DRAPES) ×2 IMPLANT
DRSG TEGADERM 4X4.75 (GAUZE/BANDAGES/DRESSINGS) ×2 IMPLANT
FILTER BLUE MILLIPORE (MISCELLANEOUS) IMPLANT
GLOVE BIOGEL PI IND STRL 6.5 (GLOVE) ×1 IMPLANT
GLOVE BIOGEL PI INDICATOR 6.5 (GLOVE) ×1
GLOVE SS BIOGEL STRL SZ 6.5 (GLOVE) ×2 IMPLANT
GLOVE SUPERSENSE BIOGEL SZ 6.5 (GLOVE) ×2
GLOVE SURG SS PI 6.5 STRL IVOR (GLOVE) ×6 IMPLANT
GOWN SRG XL XLNG 56XLVL 4 (GOWN DISPOSABLE) ×1 IMPLANT
GOWN STRL NON-REIN LRG LVL3 (GOWN DISPOSABLE) ×6 IMPLANT
GOWN STRL NON-REIN XL XLG LVL4 (GOWN DISPOSABLE) ×1
KIT BASIN OR (CUSTOM PROCEDURE TRAY) ×2 IMPLANT
KIT ROOM TURNOVER OR (KITS) IMPLANT
KNIFE GRIESHABER SHARP 2.5MM (MISCELLANEOUS) ×2 IMPLANT
LENS IOL 3 PIECE DIOP 16.5 (Intraocular Lens) ×2 IMPLANT
MASK EYE SHIELD (GAUZE/BANDAGES/DRESSINGS) ×2 IMPLANT
NEEDLE 18GX1X1/2 (RX/OR ONLY) (NEEDLE) ×2 IMPLANT
NEEDLE 22X1 1/2 (OR ONLY) (NEEDLE) ×2 IMPLANT
NEEDLE 25GX 5/8IN NON SAFETY (NEEDLE) ×2 IMPLANT
NEEDLE FILTER BLUNT 18X 1/2SAF (NEEDLE)
NEEDLE FILTER BLUNT 18X1 1/2 (NEEDLE) IMPLANT
NEEDLE HYPO 30X.5 LL (NEEDLE) ×6 IMPLANT
NS IRRIG 1000ML POUR BTL (IV SOLUTION) ×2 IMPLANT
PACK CATARACT CUSTOM (CUSTOM PROCEDURE TRAY) ×2 IMPLANT
PACK CATARACT MCHSCP (PACKS) IMPLANT
PACK COMBINED CATERACT/VIT 23G (OPHTHALMIC RELATED) IMPLANT
PAD ARMBOARD 7.5X6 YLW CONV (MISCELLANEOUS) ×2 IMPLANT
PAD EYE OVAL STERILE LF (GAUZE/BANDAGES/DRESSINGS) ×2 IMPLANT
PHACO TIP KELMAN 45DEG (TIP) ×2 IMPLANT
PROBE ANTERIOR 20G W/INFUS NDL (MISCELLANEOUS) IMPLANT
ROLLS DENTAL (MISCELLANEOUS) IMPLANT
SHUTTLE MONARCH TYPE A (NEEDLE) ×2 IMPLANT
SOLUTION ANTI FOG 6CC (MISCELLANEOUS) ×2 IMPLANT
SPEAR EYE SURG WECK-CEL (MISCELLANEOUS) ×2 IMPLANT
SUT ETHILON 10-0 CS-B-6CS-B-6 (SUTURE)
SUT ETHILON 5 0 P 3 18 (SUTURE)
SUT ETHILON 9 0 TG140 8 (SUTURE) IMPLANT
SUT NYLON ETHILON 5-0 P-3 1X18 (SUTURE) IMPLANT
SUT PLAIN 6 0 TG1408 (SUTURE) IMPLANT
SUT POLY NON ABSORB 10-0 8 STR (SUTURE) IMPLANT
SUT VICRYL 6 0 S 29 12 (SUTURE) IMPLANT
SUTURE EHLN 10-0 CS-B-6CS-B-6 (SUTURE) IMPLANT
SYR 20CC LL (SYRINGE) IMPLANT
SYR 5ML LL (SYRINGE) IMPLANT
SYR TB 1ML LUER SLIP (SYRINGE) ×2 IMPLANT
SYRINGE 10CC LL (SYRINGE) IMPLANT
TOWEL OR 17X24 6PK STRL BLUE (TOWEL DISPOSABLE) ×4 IMPLANT
WATER STERILE IRR 1000ML POUR (IV SOLUTION) ×2 IMPLANT
WIPE INSTRUMENT VISIWIPE 73X73 (MISCELLANEOUS) ×2 IMPLANT

## 2011-12-07 NOTE — Op Note (Signed)
Maria Kline 12/07/2011 Cataract  Procedure: Phacoemulsification, Posterior Chamber Intra-ocular Lens Operative Eye:  right eye  Surgeon: Shade Flood Estimated Blood Loss: minimal Specimens for Pathology:  None Complications: none  The patient was prepared and draped in the usual manner for ocular surgery on the right eye. A Cook lid speculum was placed. A peripheral clear corneal incision was made at the surgical limbus centered at the 11:00 meridian. A separate clear corneal stab incision was made with a 15 degree blade at the 2:00 meridian to permit bi-manual technique. Provisc was instilled into the anterior chamber through that incision.  A keratome was used to create a self sealing incision entering the anterior chamber at the 11:00 meridian. A capsulorhexis was performed using a bent 25g needle. The lens was hydrodissected and the nucleus was hydrodilineated using a Nichammin cannula. The Chang chopper was inserted and used to rotate the lens to insure adequate lens mobility. The phacoemulsification handpiece was inserted and a combined phaco-chop technique was employed, fracturing the lens into separate sections with subsequent removal with the phaco handpiece.   The I/A cannula was used to remove remaining lens cortex. Provisc was instilled and used to deepen the anterior chamber and posterior capsule bag. The Monarch injector was used to place a folded Acrysof [id number] Expand Series  PC IOL, + 0.3  diopters, into the capsule bag. A Sinskey lens hook was used to dial in the trailing haptic.  The I/A cannula was used to remove the viscoelastic from the anterior chamber. BSS was used to bring IOP to the desired range and the wound was checked to insure it was watertight. Subconjunctival injections of Ance 100/0.44ml and Dexamethasone 4mg /73ml were placed without complication. The lid speculum and drapes were removed and the patient's eye was patched with Polymixin/Bacitracin ophthalmic  ointment. An eye shield was placed and the patient was transferred alert and conversant from the operating room to the post-operative recovery area.   Shade Flood, MD

## 2011-12-07 NOTE — Discharge Instructions (Addendum)
Maria Kline      12/07/2011  Post-operative instructions for Maria L. Maria Kindred, MD  Caring for your eye:  Do not rub your eye and wash your hands before touching the eye area. This is important to avoid injury and infection.  You may use sterile gauze pads and sterile eye wash to cleanse the lid margins of mucous accumulation.  DO NOT REUSE GAUZE after wiping the eye. Use a new clean one if needed.  Be certain not to touch the top of the medication bottle to the eyelids to avoid contaminating your medicine bottle and causing infection.  After eye surgery the surface of the eye and eyelids may be puffy. You may note a red blotch(s) on the surface of the eye or a bruise on your eyelid. These are usually related to injections or instruments used in surgery and are not cause for alarm. You may also notice blood tinged tears on your eye pad, this is common and not cause for alarm. These findings will subside over the coming week or two.  Activity:  No jarring activities. Walking with assistance early on as needed is advised. Avoid straining and let me know if you have significant constipation. Do not bend over at the waist with the head below your waist to minimize risk of bleeding inside the eye.  Avoid getting water from washing your hair or showering  in your eye. Patch the eye if necessary during bathing to avoid contamination.    You may: watch television, work on you computer, read books, eat out, ride in a car.  Sleeping Position:    You do not have a gas bubble in your eye, you may sleep in your         customary manner.   Wear your eye shield for naps and sleeping at night for the first two weeks.   Wait a few minutes between your eye drops when placing them.   Resume your customary medications on your normal schedule. Shade Flood MD    After office hours I can be reached by calling: [phone]                                                                     or    [phone]   Instructions Following General Anesthetic, Adult A nurse specialized in giving anesthesia (anesthetist) or a doctor specialized in giving anesthesia (anesthesiologist) gave you a medicine that made you sleep while a procedure was performed. For as long as 24 hours following this procedure, you may feel:  Dizzy.   Weak.   Drowsy.  AFTER THE PROCEDURE After surgery, you will be taken to the recovery area where a nurse will monitor your progress. You will be allowed to go home when you are awake, stable, taking fluids well, and without complications. For the first 24 hours following an anesthetic:  Have a responsible person with you.   Do not drive a car. If you are alone, do not take public transportation.   Do not drink alcohol.   Do not take medicine that has not been prescribed by your caregiver.   Do not sign important papers or make important decisions.   You may resume normal diet and activities as directed.  Change bandages (dressings) as directed.   Only take over-the-counter or prescription medicines for pain, discomfort, or fever as directed by your caregiver.  If you have questions or problems that seem related to the anesthetic, call the hospital and ask for the anesthetist or anesthesiologist on call. SEEK IMMEDIATE MEDICAL CARE IF:   You develop a rash.   You have difficulty breathing.   You have chest pain.   You develop any allergic problems.  Document Released: 09/21/2000 Document Revised: 06/04/2011 Document Reviewed: 05/02/2007 [facility] Patient Information 2012 [place], [place].

## 2011-12-07 NOTE — Preoperative (Signed)
Beta Blockers   Reason not to administer Beta Blockers:Not Applicable 

## 2011-12-07 NOTE — Anesthesia Postprocedure Evaluation (Signed)
  Anesthesia Post-op Note  Patient: Maria Kline  Procedure(s) Performed: Procedure(s) (LRB): CATARACT EXTRACTION PHACO AND INTRAOCULAR LENS PLACEMENT (IOC) (Right)  Patient Location: Short Stay  Anesthesia Type: MAC  Level of Consciousness: awake, alert  and oriented  Airway and Oxygen Therapy: Patient Spontanous Breathing  Post-op Pain: none  Post-op Assessment: Post-op Vital signs reviewed, Patient's Cardiovascular Status Stable, Respiratory Function Stable, Patent Airway and No signs of Nausea or vomiting  Post-op Vital Signs: Reviewed and stable  Complications: No apparent anesthesia complications

## 2011-12-07 NOTE — Interval H&P Note (Signed)
History and Physical Interval Note:  12/07/2011 7:30 AM  Maria Kline  has presented today for surgery, with the diagnosis of Cataract  The various methods of treatment have been discussed with the patient and family. After consideration of risks, benefits and other options for treatment, the patient has consented to  Procedure(s) (LRB): CATARACT EXTRACTION PHACO AND INTRAOCULAR LENS PLACEMENT (IOC) (Right) as a surgical intervention .  The patients' history has been reviewed, patient examined, no change in status, stable for surgery.  I have reviewed the patients' chart and labs.  Questions were answered to the patient's satisfaction.     Timberlee Roblero,MD

## 2011-12-07 NOTE — Progress Notes (Signed)
Surgical pcr screen cancelled per protocol ( not indicated for cataract surgery).

## 2011-12-07 NOTE — Transfer of Care (Signed)
Immediate Anesthesia Transfer of Care Note  Patient: Maria Kline  Procedure(s) Performed: Procedure(s) (LRB): CATARACT EXTRACTION PHACO AND INTRAOCULAR LENS PLACEMENT (IOC) (Right)  Patient Location: Short Stay  Anesthesia Type: MAC  Level of Consciousness: awake, alert  and oriented  Airway & Oxygen Therapy: Patient Spontanous Breathing and Patient connected to nasal cannula oxygen  Post-op Assessment: Report given to PACU RN, Post -op Vital signs reviewed and stable and Patient moving all extremities X 4  Post vital signs: Reviewed and stable  Complications: No apparent anesthesia complications

## 2011-12-07 NOTE — H&P (View-Only) (Signed)
Pre-operative History and Physical for Ophthalmic Surgery  Maria Kline 12/03/2011                  Chief Complaint: Decreased vision  Diagnosis: Cataract Right Eye  Allergies  Allergen Reactions  . Darvocet (Propoxyphene-Acetaminophen)     Patient states that she is not allergic to this medication  . Demerol (Meperidine) Itching  . Hydrocodone     Patient states that she is not allergic to this medication  . Penicillins Other (See Comments)    Childhood reaction     Prior to Admission medications   Medication Sig Start Date End Date Taking? Authorizing Provider  b complex vitamins capsule Take 1 capsule by mouth daily.    Historical Provider, MD  beta carotene w/minerals (OCUVITE) tablet Take 1 tablet by mouth daily.    Historical Provider, MD  lactobacillus acidophilus (BACID) TABS Take 2 tablets by mouth 3 (three) times daily.    Historical Provider, MD  Polyethyl Glycol-Propyl Glycol (SYSTANE) 0.4-0.3 % GEL Place 1 application into both eyes 4 (four) times daily as needed. For dry eyes    Historical Provider, MD  Vitamin D, Ergocalciferol, (DRISDOL) 50000 UNITS CAPS Take 50,000 Units by mouth 2 (two) times a week. Tuesdays and fridays    Historical Provider, MD    Planned Procedure:                                       Phacoemulsification, Posterior Chamber Intra-ocular Lens Right Eye                                      Acrysof Expand Series PC IOL  +3.00 for implant OD  There were no vitals filed for this visit.  Pulse: 70         Temp: NE        Resp:  16      ROS: negative  Past Medical History  Diagnosis Date  . Chicken pox   . Measles   . Mumps   . Complication of anesthesia 2000    c-section; excessive shivering  . Hx of seasonal allergies   . Vitamin d deficiency     Vit D on tues and fri  . Eczema     hx of  . Anemia     45yrs ago  . Cataracts, bilateral     Past Surgical History  Procedure Date  . Myomectomy 1996  . Wisdom tooth  extraction   . Adenodectomy   . Cesarean section 2000  . Tonsillectomy     as a child  . Colonoscopy   . Cataract extraction w/phaco 11/25/2011    Procedure: CATARACT EXTRACTION PHACO AND INTRAOCULAR LENS PLACEMENT (IOC);  Surgeon: Shade Flood, MD;  Location: [facility] OR;  Service: Ophthalmology;  Laterality: Left;     History   Social History  . Marital Status: Married    Spouse Name: N/A    Number of Children: N/A  . Years of Education: N/A   Occupational History  . Not on file.   Social History Main Topics  . Smoking status: Never Smoker   . Smokeless tobacco: Not on file  . Alcohol Use: Yes     socially  . Drug Use: No  . Sexually Active: Yes    Birth Control/ Protection:  Pill   Other Topics Concern  . Not on file   Social History Narrative  . No narrative on file     The following examination is for anesthesia clearance for minimally invasive Ophthalmic surgery. It is primarily to document heart and lung findings and is not intended to elucidate unknown general medical conditions inclusive of abdominal masses, lung lesions, etc.   General Constitution:  Within Normal Limits   Alertness/Orientation:  Person, time place     yes   HEENT:  Eye Findings: Cataract                   right eye  Neck: supple without masses  Chest/Lungs: clear to auscultation  Cardiac: Normal S1 and S2 without Murmur, S3 or S4  Neuro: non-focal   Impression: Cataract Right Eye  Planned Procedure:  Phacoemulsification, Posterior Chamber Intraocular Lens     Shade Flood, MD

## 2011-12-07 NOTE — Anesthesia Procedure Notes (Signed)
Procedure Name: MAC Date/Time: 12/07/2011 8:59 AM Performed by: Marena Chancy Pre-anesthesia Checklist: Patient identified, Timeout performed, Emergency Drugs available, Suction available and Patient being monitored Oxygen Delivery Method: Nasal cannula Intubation Type: IV induction

## 2011-12-07 NOTE — Anesthesia Preprocedure Evaluation (Addendum)
Anesthesia Evaluation  Patient identified by MRN, date of birth, ID band Patient awake    Reviewed: Allergy & Precautions, H&P , NPO status , Patient's Chart, lab work & pertinent test results  History of Anesthesia Complications (+) AWARENESS UNDER ANESTHESIANegative for: history of anesthetic complications  Airway Mallampati: II      Dental  (+) Teeth Intact   Pulmonary          Cardiovascular     Neuro/Psych    GI/Hepatic   Endo/Other    Renal/GU      Musculoskeletal   Abdominal   Peds  Hematology   Anesthesia Other Findings   Reproductive/Obstetrics                          Anesthesia Physical Anesthesia Plan  ASA: II  Anesthesia Plan: MAC   Post-op Pain Management:    Induction: Intravenous  Airway Management Planned: Nasal Cannula  Additional Equipment:   Intra-op Plan:   Post-operative Plan:   Informed Consent: I have reviewed the patients History and Physical, chart, labs and discussed the procedure including the risks, benefits and alternatives for the proposed anesthesia with the patient or authorized representative who has indicated his/her understanding and acceptance.   Dental advisory given  Plan Discussed with: CRNA, Anesthesiologist and Surgeon  Anesthesia Plan Comments:        Anesthesia Quick Evaluation

## 2011-12-08 ENCOUNTER — Encounter (HOSPITAL_COMMUNITY): Payer: Self-pay | Admitting: Ophthalmology

## 2012-01-11 ENCOUNTER — Ambulatory Visit (HOSPITAL_COMMUNITY)
Admission: RE | Admit: 2012-01-11 | Discharge: 2012-01-11 | Disposition: A | Discharge status: Home / Self Care | Payer: BC Managed Care – PPO | Source: Ambulatory Visit | Attending: Obstetrics and Gynecology | Admitting: Obstetrics and Gynecology

## 2012-01-11 DIAGNOSIS — Z1231 Encounter for screening mammogram for malignant neoplasm of breast: Secondary | ICD-10-CM | POA: Insufficient documentation

## 2012-02-18 ENCOUNTER — Ambulatory Visit (HOSPITAL_COMMUNITY): Admission: RE | Admit: 2012-02-18 | Payer: BC Managed Care – PPO | Source: Ambulatory Visit | Admitting: Ophthalmology

## 2012-02-18 ENCOUNTER — Encounter (HOSPITAL_COMMUNITY): Admission: RE | Payer: Self-pay | Source: Ambulatory Visit

## 2012-02-18 SURGERY — MINOR CAPSULOTOMY
Anesthesia: LOCAL | Laterality: Bilateral

## 2012-03-09 ENCOUNTER — Encounter (HOSPITAL_COMMUNITY): Payer: Self-pay | Admitting: Pharmacy Technician

## 2012-03-10 ENCOUNTER — Encounter (HOSPITAL_COMMUNITY)
Admission: RE | Disposition: A | Discharge status: Home / Self Care | Payer: Self-pay | Source: Ambulatory Visit | Attending: Ophthalmology

## 2012-03-10 ENCOUNTER — Encounter: Payer: Self-pay | Admitting: Ophthalmology

## 2012-03-10 ENCOUNTER — Ambulatory Visit: Admit: 2012-03-10 | Payer: Self-pay | Admitting: Ophthalmology

## 2012-03-10 ENCOUNTER — Ambulatory Visit (HOSPITAL_COMMUNITY)
Admission: RE | Admit: 2012-03-10 | Discharge: 2012-03-10 | Disposition: A | Discharge status: Home / Self Care | Payer: BC Managed Care – PPO | Source: Ambulatory Visit | Attending: Ophthalmology | Admitting: Ophthalmology

## 2012-03-10 ENCOUNTER — Other Ambulatory Visit: Payer: Self-pay | Admitting: Ophthalmology

## 2012-03-10 DIAGNOSIS — H269 Unspecified cataract: Secondary | ICD-10-CM | POA: Insufficient documentation

## 2012-03-10 HISTORY — PX: CAPSULOTOMY: SHX5412

## 2012-03-10 SURGERY — MINOR CAPSULOTOMY
Anesthesia: Choice | Laterality: Left

## 2012-03-10 SURGERY — MINOR CAPSULOTOMY
Anesthesia: LOCAL | Laterality: Left

## 2012-03-10 MED ORDER — APRACLONIDINE HCL 0.5 % OP SOLN: 1.0000 [drp] | Freq: Once | OPHTHALMIC | Status: DC

## 2012-03-10 MED ORDER — APRACLONIDINE HCL 1 % OP SOLN: 1.0000 [drp] | Freq: Once | OPHTHALMIC | Status: DC

## 2012-03-10 MED ORDER — APRACLONIDINE HCL 0.5 % OP SOLN
OPHTHALMIC | Status: AC
  Filled 2012-03-10: qty 5

## 2012-03-10 MED ORDER — CYCLOPENTOLATE-PHENYLEPHRINE 0.2-1 % OP SOLN: 1.0000 [drp] | OPHTHALMIC | Status: AC

## 2012-03-10 MED ORDER — APRACLONIDINE HCL 1 % OP SOLN
1.0000 [drp] | Freq: Two times a day (BID) | OPHTHALMIC | Status: DC | PRN
  Administered 2012-03-10: 1 [drp] via OPHTHALMIC

## 2012-03-10 MED ORDER — CYCLOPENTOLATE-PHENYLEPHRINE 0.2-1 % OP SOLN: 1.0000 [drp] | OPHTHALMIC | Status: DC

## 2012-03-10 SURGICAL SUPPLY — 27 items
APPLICATOR COTTON TIP 6IN STRL (MISCELLANEOUS) ×2 IMPLANT
BAG MINI COLL DRAIN (WOUND CARE) IMPLANT
BLADE KERATOME 2.75 (BLADE) ×2 IMPLANT
BLADE MINI RND TIP GREEN BEAV (BLADE) IMPLANT
CLOTH BEACON ORANGE TIMEOUT ST (SAFETY) ×2 IMPLANT
CORDS BIPOLAR (ELECTRODE) ×2 IMPLANT
DRAPE OPHTHALMIC 40X48 W POUCH (DRAPES) ×2 IMPLANT
DRAPE RETRACTOR (MISCELLANEOUS) ×2 IMPLANT
GLOVE ECLIPSE 7.0 STRL STRAW (GLOVE) ×2 IMPLANT
GOWN STRL NON-REIN LRG LVL3 (GOWN DISPOSABLE) ×4 IMPLANT
KIT BASIN OR (CUSTOM PROCEDURE TRAY) ×2 IMPLANT
KIT ROOM TURNOVER OR (KITS) ×2 IMPLANT
KNIFE CRESCENT 2.5 55 ANG (BLADE) ×2 IMPLANT
MARKER SKIN DUAL TIP RULER LAB (MISCELLANEOUS) IMPLANT
NS IRRIG 1000ML POUR BTL (IV SOLUTION) ×2 IMPLANT
PACK CATARACT CUSTOM (CUSTOM PROCEDURE TRAY) ×2 IMPLANT
PACK CATARACT MCHSCP (PACKS) IMPLANT
PAD ARMBOARD 7.5X6 YLW CONV (MISCELLANEOUS) ×4 IMPLANT
PROBE ANTERIOR 20G W/INFUS NDL (MISCELLANEOUS) IMPLANT
SPEAR EYE SURG WECK-CEL (MISCELLANEOUS) IMPLANT
SUT ETHILON 10 0 CS140 6 (SUTURE) IMPLANT
SUT VICRYL 8 0 TG140 8 (SUTURE) IMPLANT
SYR 3ML LL SCALE MARK (SYRINGE) IMPLANT
TIP SILICONE STR (MISCELLANEOUS)
TIP SILICONE STR 0.3MM UFLOW (MISCELLANEOUS) IMPLANT
TOWEL OR 17X24 6PK STRL BLUE (TOWEL DISPOSABLE) ×4 IMPLANT
WATER STERILE IRR 1000ML POUR (IV SOLUTION) ×2 IMPLANT

## 2012-03-10 NOTE — Brief Op Note (Signed)
03/10/2012  7:18 AM  PATIENT:  Maria Kline  56 y.o. female  PRE-OPERATIVE DIAGNOSIS:  paque posperior capsule left eye  POST-OPERATIVE DIAGNOSIS:  * No post-op diagnosis entered *  PROCEDURE:  Procedure(s) (LRB) with comments: MINOR CAPSULOTOMY (Left) - Left Eye  Total Energy  52 Total Bursts    14 Shots/Bursts    1 Energy/shot      2.8  SURGEON:  Surgeon(s) and Role:    Vita Erm., MD - Primary  PHYSICIAN ASSISTANT:   ASSISTANTS: none   ANESTHESIA:   none  EBL:     BLOOD ADMINISTERED:none  DRAINS: none   LOCAL MEDICATIONS USED:  NONE  SPECIMEN:  No Specimen  DISPOSITION OF SPECIMEN:  N/A  COUNTS:  YES  TOURNIQUET:  * No tourniquets in log *  DICTATION: .Note written in EPIC  PLAN OF CARE: Discharge to home after PACU  PATIENT DISPOSITION:  PACU - hemodynamically stable.   Delay start of Pharmacological VTE agent (>24hrs) due to surgical blood loss or risk of bleeding: not applicable

## 2012-03-10 NOTE — H&P (Signed)
  56 yo female has had cataract surgery both eyes.  C/O blurred vision.  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  Admitted for yag laser capsulotomy.

## 2012-03-10 NOTE — Progress Notes (Signed)
Phase II  Not applicable. Pt. Discharge from minor room.

## 2012-03-10 NOTE — H&P (Signed)
  55 yo female has had cataract surgery both eyes.  Has developed opaque posterior capsule left eye which causes blurred vision.  Admitted for yag laser capsulotomy left eye.

## 2012-03-11 ENCOUNTER — Encounter (HOSPITAL_COMMUNITY): Payer: Self-pay

## 2012-03-12 NOTE — Op Note (Signed)
[name], Maria Kline            ACCOUNT NO.:  [id number]  MEDICAL RECORD NO.:  [id number]  LOCATION:  [facility]                         FACILITY:  [facility]  PHYSICIAN:  Salley Scarlet., M.D.DATE OF BIRTH:  1956/03/04  DATE OF PROCEDURE: DATE OF DISCHARGE:  03/10/2012                              OPERATIVE REPORT   PREOPERATIVE DIAGNOSIS:  Opaque posterior capsule, left eye.  INCOMPLETE DICTATION.     Salley Scarlet., M.D.     TB/MEDQ  D:  03/11/2012  T:  03/12/2012  Job:  [id number]

## 2012-03-12 NOTE — Op Note (Signed)
Maria Kline, Maria Kline            ACCOUNT NO.:  [id number]  MEDICAL RECORD NO.:  [id number]  LOCATION:  [facility]                         FACILITY:  [facility]  PHYSICIAN:  Salley Scarlet., M.D.DATE OF BIRTH:  Oct 25, 1955  DATE OF PROCEDURE:  03/10/2012 DATE OF DISCHARGE:  03/10/2012                              OPERATIVE REPORT   PREOPERATIVE DIAGNOSIS:  Opaque posterior capsule, left eye.  POSTOPERATIVE DIAGNOSIS:  Opaque posterior capsule, left eye.  OPERATION:  YAG laser capsulotomy, left eye.  ANESTHESIA:  None.  JUSTIFICATION FOR PROCEDURE:  This is a 56 year old lady who has undergone bilateral cataract surgery.  She has developed opacification of the posterior capsule of the left eye, which caused her to have blurred vision.  YAG laser capsulotomy was recommended.  She is admitted at this time to her purpose.  PROCEDURE:  The patient was brought to the laser room and positioned appropriately behind the YAG laser.  Fourteen applications of laser energy of 2.8 mJ were applied to the posterior capsule obtaining a satisfactory opening.  The patient tolerated the procedure well and was discharged in satisfactory condition with instructions to see me in the office this afternoon for further evaluation.  DISCHARGE DIAGNOSIS:  Opaque posterior capsule, left eye.     Salley Scarlet., M.D.     TB/MEDQ  D:  03/11/2012  T:  03/12/2012  Job:  [id number]

## 2012-03-14 ENCOUNTER — Encounter (HOSPITAL_COMMUNITY): Payer: Self-pay | Admitting: Ophthalmology

## 2012-12-14 ENCOUNTER — Other Ambulatory Visit: Payer: Self-pay | Admitting: Obstetrics and Gynecology

## 2012-12-14 DIAGNOSIS — Z1231 Encounter for screening mammogram for malignant neoplasm of breast: Secondary | ICD-10-CM

## 2013-01-13 ENCOUNTER — Ambulatory Visit (HOSPITAL_COMMUNITY)
Admission: RE | Admit: 2013-01-13 | Discharge: 2013-01-13 | Disposition: A | Discharge status: Home / Self Care | Payer: BC Managed Care – PPO | Source: Ambulatory Visit | Attending: Obstetrics and Gynecology | Admitting: Obstetrics and Gynecology

## 2013-01-13 DIAGNOSIS — Z1231 Encounter for screening mammogram for malignant neoplasm of breast: Secondary | ICD-10-CM

## 2013-10-24 ENCOUNTER — Other Ambulatory Visit: Payer: Self-pay | Admitting: Obstetrics and Gynecology

## 2013-10-24 DIAGNOSIS — E049 Nontoxic goiter, unspecified: Secondary | ICD-10-CM

## 2013-10-26 ENCOUNTER — Ambulatory Visit
Admission: RE | Admit: 2013-10-26 | Discharge: 2013-10-26 | Disposition: A | Discharge status: Home / Self Care | Payer: BC Managed Care – PPO | Source: Ambulatory Visit | Attending: Obstetrics and Gynecology | Admitting: Obstetrics and Gynecology

## 2013-10-26 DIAGNOSIS — E049 Nontoxic goiter, unspecified: Secondary | ICD-10-CM

## 2014-01-11 ENCOUNTER — Other Ambulatory Visit: Payer: Self-pay | Admitting: Obstetrics and Gynecology

## 2014-01-11 DIAGNOSIS — Z1231 Encounter for screening mammogram for malignant neoplasm of breast: Secondary | ICD-10-CM

## 2014-01-16 ENCOUNTER — Ambulatory Visit (HOSPITAL_COMMUNITY)
Admission: RE | Admit: 2014-01-16 | Discharge: 2014-01-16 | Disposition: A | Discharge status: Home / Self Care | Payer: BC Managed Care – PPO | Source: Ambulatory Visit | Attending: Obstetrics and Gynecology | Admitting: Obstetrics and Gynecology

## 2014-01-16 DIAGNOSIS — Z1231 Encounter for screening mammogram for malignant neoplasm of breast: Secondary | ICD-10-CM | POA: Insufficient documentation

## 2014-03-06 ENCOUNTER — Encounter (HOSPITAL_COMMUNITY): Payer: Self-pay | Admitting: Emergency Medicine

## 2014-03-06 ENCOUNTER — Emergency Department (HOSPITAL_COMMUNITY)
Admission: EM | Admit: 2014-03-06 | Discharge: 2014-03-06 | Disposition: A | Discharge status: Home / Self Care | Payer: BC Managed Care – PPO | Attending: Emergency Medicine | Admitting: Emergency Medicine

## 2014-03-06 DIAGNOSIS — Z79899 Other long term (current) drug therapy: Secondary | ICD-10-CM | POA: Insufficient documentation

## 2014-03-06 DIAGNOSIS — S0993XA Unspecified injury of face, initial encounter: Secondary | ICD-10-CM | POA: Insufficient documentation

## 2014-03-06 DIAGNOSIS — S139XXA Sprain of joints and ligaments of unspecified parts of neck, initial encounter: Secondary | ICD-10-CM | POA: Insufficient documentation

## 2014-03-06 DIAGNOSIS — Z8619 Personal history of other infectious and parasitic diseases: Secondary | ICD-10-CM | POA: Insufficient documentation

## 2014-03-06 DIAGNOSIS — Z880 Allergy status to penicillin: Secondary | ICD-10-CM | POA: Diagnosis not present

## 2014-03-06 DIAGNOSIS — I10 Essential (primary) hypertension: Secondary | ICD-10-CM | POA: Insufficient documentation

## 2014-03-06 DIAGNOSIS — S161XXA Strain of muscle, fascia and tendon at neck level, initial encounter: Secondary | ICD-10-CM

## 2014-03-06 DIAGNOSIS — Y9389 Activity, other specified: Secondary | ICD-10-CM | POA: Diagnosis not present

## 2014-03-06 DIAGNOSIS — Z8669 Personal history of other diseases of the nervous system and sense organs: Secondary | ICD-10-CM | POA: Insufficient documentation

## 2014-03-06 DIAGNOSIS — Z872 Personal history of diseases of the skin and subcutaneous tissue: Secondary | ICD-10-CM | POA: Insufficient documentation

## 2014-03-06 DIAGNOSIS — Z862 Personal history of diseases of the blood and blood-forming organs and certain disorders involving the immune mechanism: Secondary | ICD-10-CM | POA: Diagnosis not present

## 2014-03-06 DIAGNOSIS — S199XXA Unspecified injury of neck, initial encounter: Secondary | ICD-10-CM

## 2014-03-06 DIAGNOSIS — Z791 Long term (current) use of non-steroidal anti-inflammatories (NSAID): Secondary | ICD-10-CM | POA: Insufficient documentation

## 2014-03-06 DIAGNOSIS — Y92410 Unspecified street and highway as the place of occurrence of the external cause: Secondary | ICD-10-CM | POA: Insufficient documentation

## 2014-03-06 MED ORDER — MELOXICAM 15 MG PO TABS: 15.0000 mg | ORAL_TABLET | Freq: Every day | ORAL | Status: DC

## 2014-03-06 MED ORDER — HYDROCODONE-ACETAMINOPHEN 5-325 MG PO TABS: 2.0000 | ORAL_TABLET | ORAL | Status: DC | PRN
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
## 2014-03-09 NOTE — ED Provider Notes (Signed)
Medical screening examination/treatment/procedure(s) were performed by non-physician practitioner and as supervising physician I was immediately available for consultation/collaboration.   EKG Interpretation None       Wynter Isaacs R. Arvine Clayburn, MD 03/09/14 2317 

## 2015-07-21 IMAGING — MG MM DIGITAL SCREENING BILAT W/ CAD
4 series · 4 of 4 positions shown · non-contrast
Comparison: Previous exam(s).

CLINICAL DATA: Screening.

EXAM:
DIGITAL SCREENING BILATERAL MAMMOGRAM WITH CAD

[R CC]
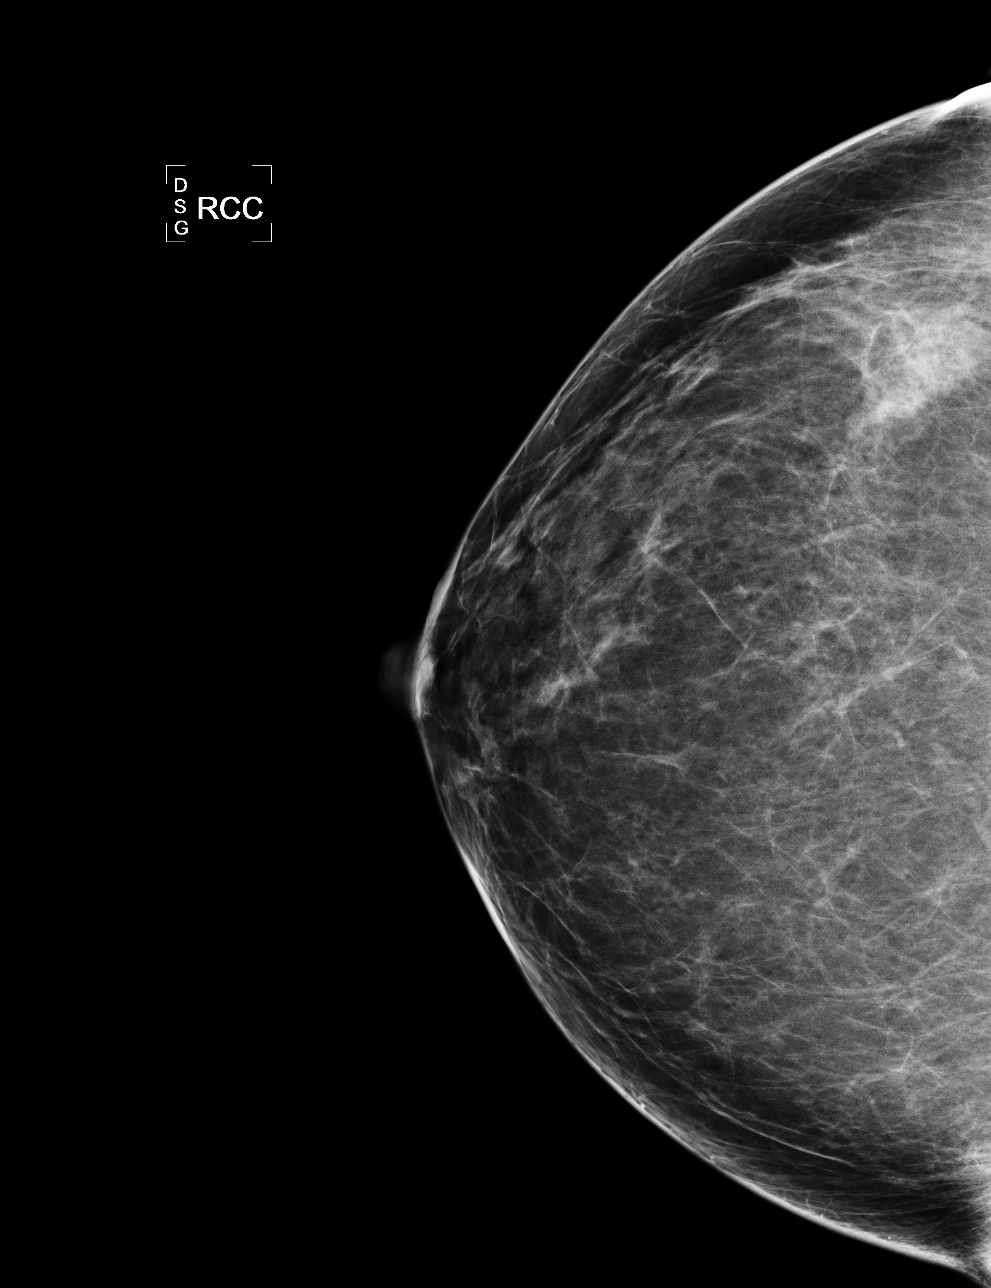

[R MLO]
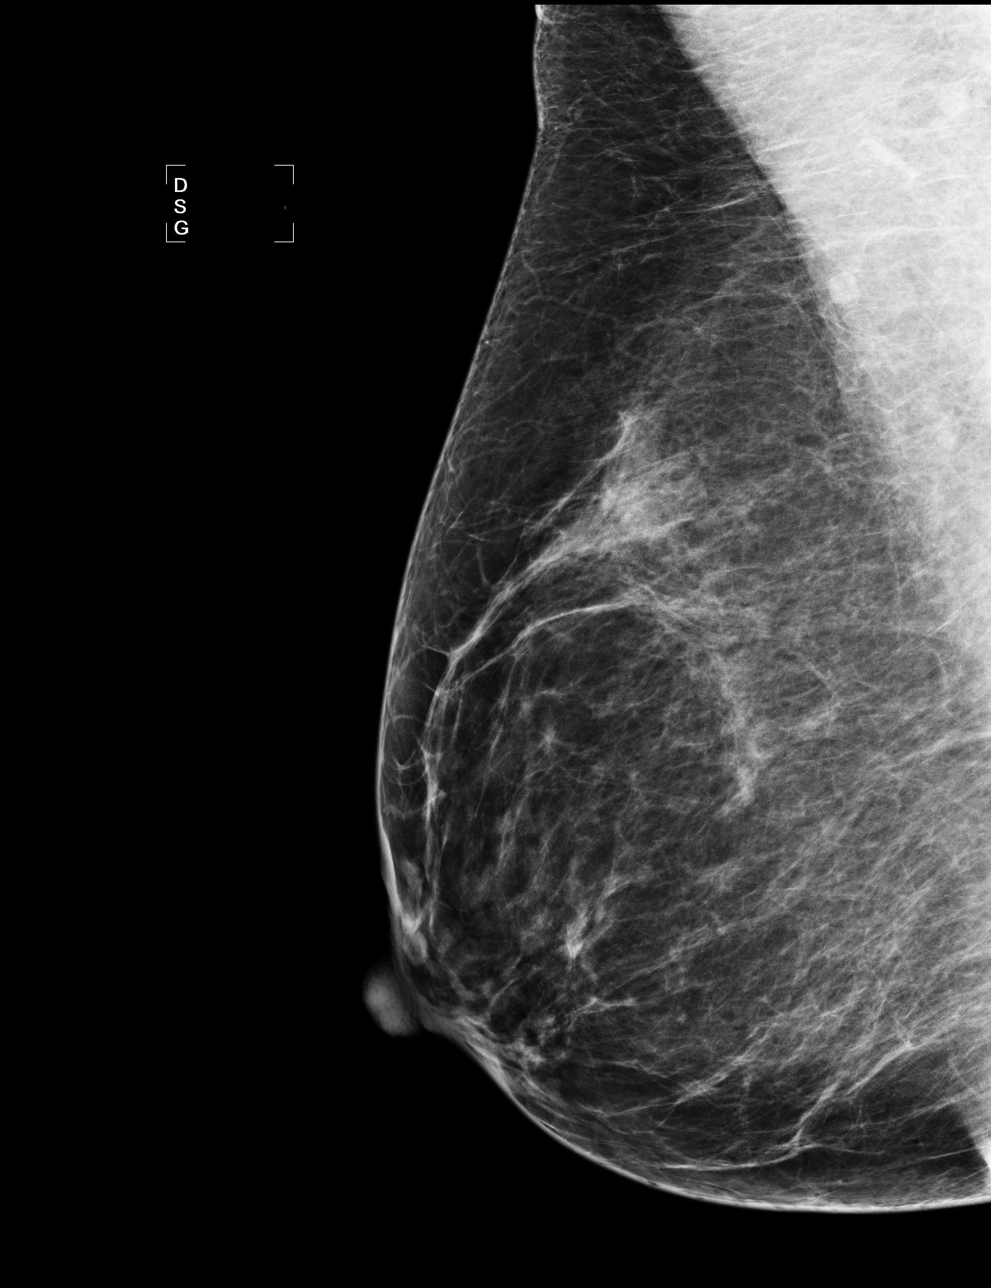

[L CC]
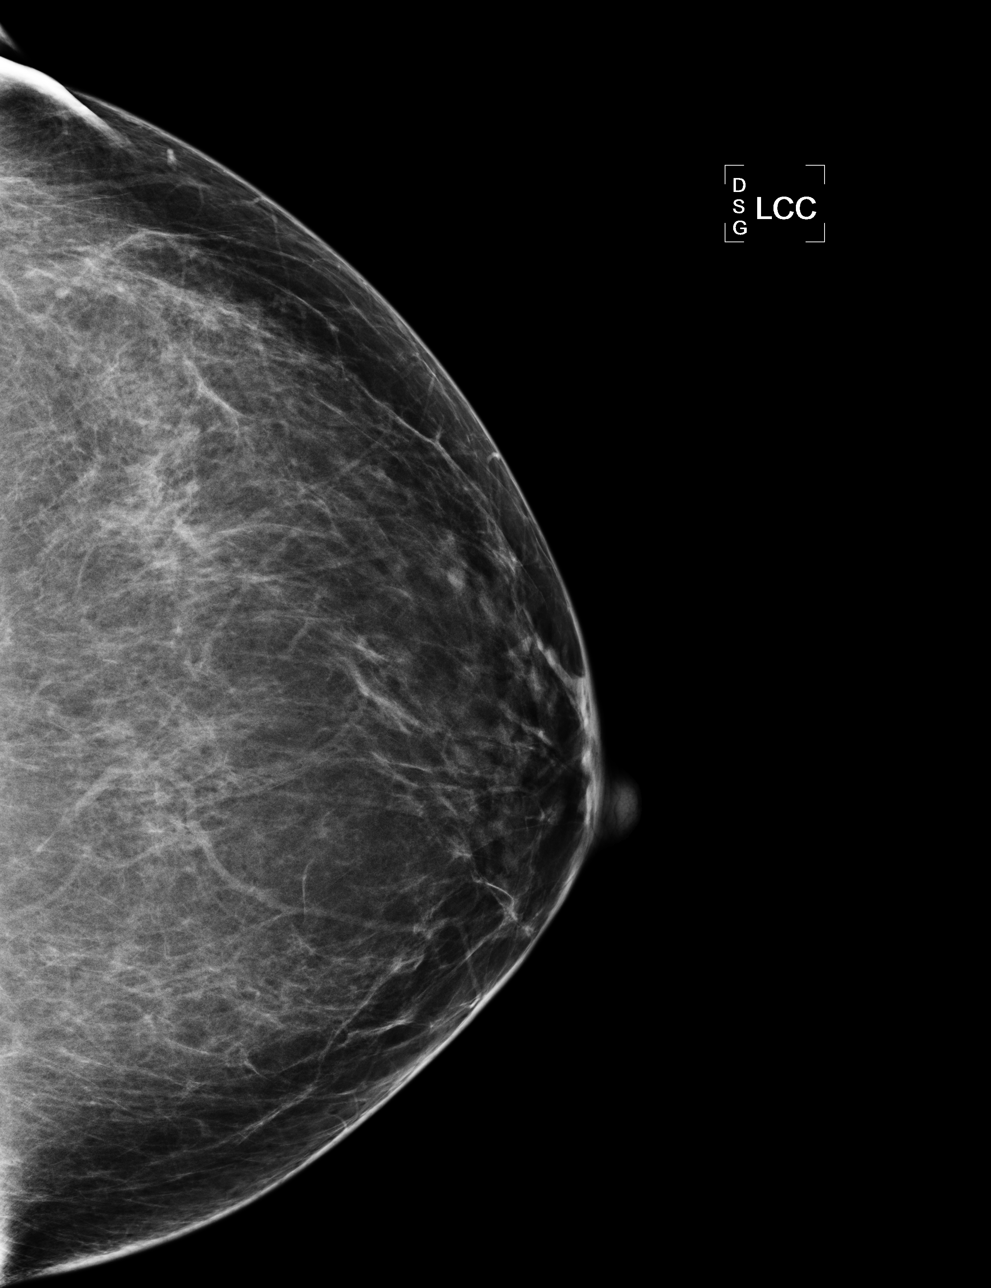

[L MLO]
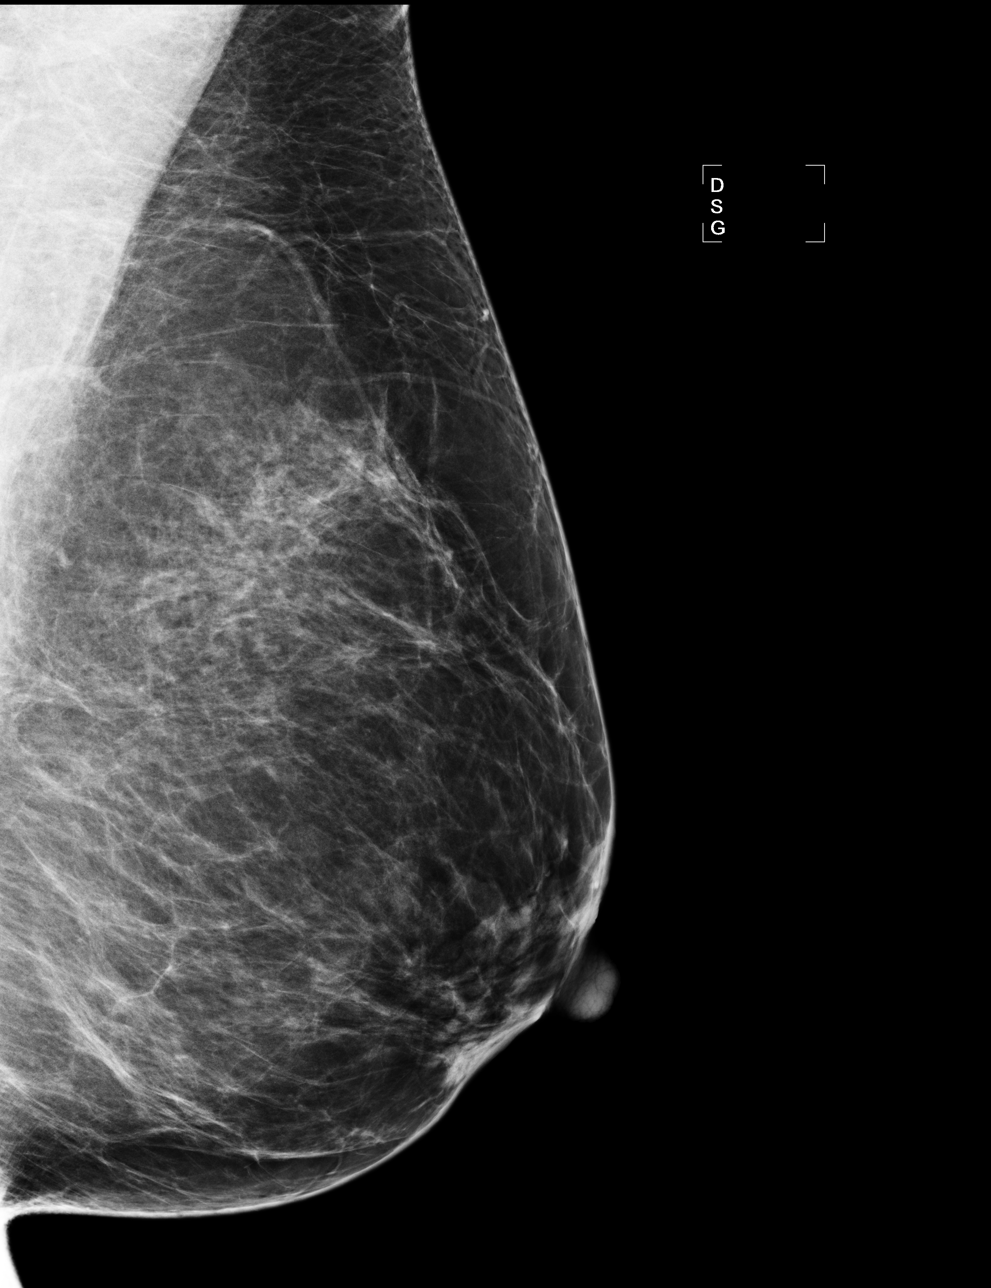

[4 of 4 positions shown; findings below may reference images not displayed]

ACR Breast Density Category b: There are scattered areas of
fibroglandular density.
FINDINGS: There are no findings suspicious for malignancy. Images were
processed with CAD.
IMPRESSION: No mammographic evidence of malignancy. A result letter of this
screening mammogram will be mailed directly to the patient.

RECOMMENDATION:
Screening mammogram in one year. (Code:AS-G-LCT)

BI-RADS CATEGORY  1: Negative.

## 2016-01-16 LAB — HM HEPATITIS C SCREENING LAB: HM Hepatitis Screen: NEGATIVE

## 2016-03-20 ENCOUNTER — Other Ambulatory Visit: Payer: Self-pay

## 2016-11-17 LAB — HM COLONOSCOPY

## 2017-08-23 LAB — HEMOGLOBIN A1C: Hemoglobin A1C: 5.5

## 2017-08-23 LAB — BASIC METABOLIC PANEL
BUN: 11 (ref 4–21)
Glucose: 84
Potassium: 4.1 (ref 3.4–5.3)
Sodium: 140 (ref 137–147)

## 2017-08-23 LAB — LIPID PANEL
CHOLESTEROL: 243 — AB (ref 0–200)
HDL: 76 — AB (ref 35–70)
LDL Cholesterol: 131
TRIGLYCERIDES: 179 — AB (ref 40–160)

## 2018-01-31 LAB — HM PAP SMEAR: HM Pap smear: NORMAL

## 2018-02-27 HISTORY — PX: REFRACTIVE SURGERY: SHX103

## 2018-03-16 ENCOUNTER — Encounter: Payer: Self-pay | Admitting: Nurse Practitioner

## 2018-04-12 ENCOUNTER — Encounter: Payer: Self-pay | Admitting: Internal Medicine

## 2018-04-12 ENCOUNTER — Ambulatory Visit (INDEPENDENT_AMBULATORY_CARE_PROVIDER_SITE_OTHER): Payer: BC Managed Care – PPO | Admitting: Internal Medicine

## 2018-04-12 VITALS — BP 124/78 | HR 64 | Temp 98.0°F | Ht 62.5 in | Wt 182.4 lb

## 2018-04-12 DIAGNOSIS — Z0000 Encounter for general adult medical examination without abnormal findings: Secondary | ICD-10-CM

## 2018-04-12 DIAGNOSIS — Z23 Encounter for immunization: Secondary | ICD-10-CM

## 2018-04-12 DIAGNOSIS — E7800 Pure hypercholesterolemia, unspecified: Secondary | ICD-10-CM | POA: Diagnosis not present

## 2018-04-12 LAB — CMP14+EGFR
ALT: 42 IU/L — AB (ref 0–32)
AST: 26 IU/L (ref 0–40)
Albumin/Globulin Ratio: 1.6 (ref 1.2–2.2)
Albumin: 4.1 g/dL (ref 3.6–4.8)
Alkaline Phosphatase: 87 IU/L (ref 39–117)
BUN/Creatinine Ratio: 19 (ref 12–28)
BUN: 14 mg/dL (ref 8–27)
Bilirubin Total: 0.4 mg/dL (ref 0.0–1.2)
CALCIUM: 9.4 mg/dL (ref 8.7–10.3)
CO2: 24 mmol/L (ref 20–29)
CREATININE: 0.74 mg/dL (ref 0.57–1.00)
Chloride: 103 mmol/L (ref 96–106)
GFR, EST AFRICAN AMERICAN: 102 mL/min/{1.73_m2} (ref >59)
GFR, EST NON AFRICAN AMERICAN: 88 mL/min/{1.73_m2} (ref >59)
GLOBULIN, TOTAL: 2.6 g/dL (ref 1.5–4.5)
Glucose: 81 mg/dL (ref 65–99)
POTASSIUM: 4.4 mmol/L (ref 3.5–5.2)
Sodium: 142 mmol/L (ref 134–144)
TOTAL PROTEIN: 6.7 g/dL (ref 6.0–8.5)

## 2018-04-12 LAB — POCT URINALYSIS DIPSTICK
Bilirubin, UA: NEGATIVE
Glucose, UA: NEGATIVE
KETONES UA: NEGATIVE
Nitrite, UA: NEGATIVE
PH UA: 6 (ref 5.0–8.0)
PROTEIN UA: NEGATIVE
Spec Grav, UA: 1.025 (ref 1.010–1.025)
UROBILINOGEN UA: 0.2 U/dL

## 2018-04-12 LAB — CBC WITH DIFFERENTIAL/PLATELET
Basophils Absolute: 0 10*3/uL (ref 0.0–0.2)
Basos: 1 %
EOS (ABSOLUTE): 0.1 10*3/uL (ref 0.0–0.4)
EOS: 2 %
HEMATOCRIT: 40.4 % (ref 34.0–46.6)
Hemoglobin: 13.1 g/dL (ref 11.1–15.9)
IMMATURE GRANS (ABS): 0 10*3/uL (ref 0.0–0.1)
IMMATURE GRANULOCYTES: 0 %
LYMPHS: 44 %
Lymphocytes Absolute: 1.5 10*3/uL (ref 0.7–3.1)
MCH: 27.9 pg (ref 26.6–33.0)
MCHC: 32.4 g/dL (ref 31.5–35.7)
MCV: 86 fL (ref 79–97)
MONOCYTES: 8 %
Monocytes Absolute: 0.3 10*3/uL (ref 0.1–0.9)
NEUTROS PCT: 45 %
Neutrophils Absolute: 1.6 10*3/uL (ref 1.4–7.0)
Platelets: 263 10*3/uL (ref 150–450)
RBC: 4.69 x10E6/uL (ref 3.77–5.28)
RDW: 13.3 % (ref 12.3–15.4)
WBC: 3.4 10*3/uL (ref 3.4–10.8)
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
## 2018-04-12 NOTE — Patient Instructions (Signed)

## 2018-04-14 NOTE — Progress Notes (Signed)
Your blood count is normal. Your kidney function is normal. Your liver enzymes are slightly elevated. Please cut out processed foods and sugary drinks. I would like to recheck this in six weeks - schedule lab visit - 6 weeks, dx: R94.5.

## 2018-05-20 ENCOUNTER — Other Ambulatory Visit: Payer: BC Managed Care – PPO

## 2018-05-20 DIAGNOSIS — R945 Abnormal results of liver function studies: Secondary | ICD-10-CM

## 2018-05-21 LAB — HEPATIC FUNCTION PANEL
ALBUMIN: 4.1 g/dL (ref 3.6–4.8)
ALT: 36 IU/L — ABNORMAL HIGH (ref 0–32)
AST: 25 IU/L (ref 0–40)
Alkaline Phosphatase: 84 IU/L (ref 39–117)
Bilirubin Total: 0.4 mg/dL (ref 0.0–1.2)
Bilirubin, Direct: 0.1 mg/dL (ref 0.00–0.40)
TOTAL PROTEIN: 6.9 g/dL (ref 6.0–8.5)

## 2018-10-13 ENCOUNTER — Ambulatory Visit (INDEPENDENT_AMBULATORY_CARE_PROVIDER_SITE_OTHER): Payer: BC Managed Care – PPO | Admitting: Internal Medicine

## 2018-10-13 ENCOUNTER — Encounter: Payer: Self-pay | Admitting: Internal Medicine

## 2018-10-13 ENCOUNTER — Other Ambulatory Visit: Payer: Self-pay

## 2018-10-13 VITALS — Temp 98.9°F | Ht 62.5 in | Wt 176.0 lb

## 2018-10-13 DIAGNOSIS — E6609 Other obesity due to excess calories: Secondary | ICD-10-CM | POA: Diagnosis not present

## 2018-10-13 DIAGNOSIS — Z6831 Body mass index (BMI) 31.0-31.9, adult: Secondary | ICD-10-CM

## 2018-10-13 DIAGNOSIS — J301 Allergic rhinitis due to pollen: Secondary | ICD-10-CM

## 2018-10-13 DIAGNOSIS — J4520 Mild intermittent asthma, uncomplicated: Secondary | ICD-10-CM | POA: Diagnosis not present

## 2018-10-13 DIAGNOSIS — E7800 Pure hypercholesterolemia, unspecified: Secondary | ICD-10-CM

## 2018-10-13 MED ORDER — ALBUTEROL SULFATE HFA 108 (90 BASE) MCG/ACT IN AERS: 2.0000 | INHALATION_SPRAY | Freq: Four times a day (QID) | RESPIRATORY_TRACT | 2 refills | Status: DC | PRN

## 2018-10-13 NOTE — Patient Instructions (Signed)
Asthma, Adult    Asthma is a long-term (chronic) condition in which the airways get tight and narrow. The airways are the breathing passages that lead from the nose and mouth down into the lungs. A person with asthma will have times when symptoms get worse. These are called asthma attacks. They can cause coughing, whistling sounds when you breathe (wheezing), shortness of breath, and chest pain. They can make it hard to breathe. There is no cure for asthma, but medicines and lifestyle changes can help control it.  There are many things that can bring on an asthma attack or make asthma symptoms worse (triggers). Common triggers include:  · Mold.  · Dust.  · Cigarette smoke.  · Cockroaches.  · Things that can cause allergy symptoms (allergens). These include animal skin flakes (dander) and pollen from trees or grass.  · Things that pollute the air. These may include household cleaners, wood smoke, smog, or chemical odors.  · Cold air, weather changes, and wind.  · Crying or laughing hard.  · Stress.  · Certain medicines or drugs.  · Certain foods such as dried fruit, potato chips, and grape juice.  · Infections, such as a cold or the flu.  · Certain medical conditions or diseases.  · Exercise or tiring activities.  Asthma may be treated with medicines and by staying away from the things that cause asthma attacks. Types of medicines may include:  · Controller medicines. These help prevent asthma symptoms. They are usually taken every day.  · Fast-acting reliever or rescue medicines. These quickly relieve asthma symptoms. They are used as needed and provide short-term relief.  · Allergy medicines if your attacks are brought on by allergens.  · Medicines to help control the body's defense (immune) system.  Follow these instructions at home:  Avoiding triggers in your home  · Change your heating and air conditioning filter often.  · Limit your use of fireplaces and wood stoves.  · Get rid of pests (such as roaches and  mice) and their droppings.  · Throw away plants if you see mold on them.  · Clean your floors. Dust regularly. Use cleaning products that do not smell.  · Have someone vacuum when you are not home. Use a vacuum cleaner with a HEPA filter if possible.  · Replace carpet with wood, tile, or vinyl flooring. Carpet can trap animal skin flakes and dust.  · Use allergy-proof pillows, mattress covers, and box spring covers.  · Wash bed sheets and blankets every week in hot water. Dry them in a dryer.  · Keep your bedroom free of any triggers.   · Avoid pets and keep windows closed when things that cause allergy symptoms are in the air.  · Use blankets that are made of polyester or cotton.  · Clean bathrooms and kitchens with bleach. If possible, have someone repaint the walls in these rooms with mold-resistant paint. Keep out of the rooms that are being cleaned and painted.  · Wash your hands often with soap and water. If soap and water are not available, use hand sanitizer.  · Do not allow anyone to smoke in your home.  General instructions  · Take over-the-counter and prescription medicines only as told by your doctor.  ? Talk with your doctor if you have questions about how or when to take your medicines.  ? Make note if you need to use your medicines more often than usual.  · Do not use any products that   contain nicotine or tobacco, such as cigarettes and e-cigarettes. If you need help quitting, ask your doctor.  · Stay away from secondhand smoke.  · Avoid doing things outdoors when allergen counts are high and when air quality is low.  · Wear a ski mask when doing outdoor activities in the winter. The mask should cover your nose and mouth. Exercise indoors on cold days if you can.  · Warm up before you exercise. Take time to cool down after exercise.  · Use a peak flow meter as told by your doctor. A peak flow meter is a tool that measures how well the lungs are working.  · Keep track of the peak flow meter's readings.  Write them down.  · Follow your asthma action plan. This is a written plan for taking care of your asthma and treating your attacks.  · Make sure you get all the shots (vaccines) that your doctor recommends. Ask your doctor about a flu shot and a pneumonia shot.  · Keep all follow-up visits as told by your doctor. This is important.  Contact a doctor if:  · You have wheezing, shortness of breath, or a cough even while taking medicine to prevent attacks.  · The mucus you cough up (sputum) is thicker than usual.  · The mucus you cough up changes from clear or white to yellow, green, gray, or bloody.  · You have problems from the medicine you are taking, such as:  ? A rash.  ? Itching.  ? Swelling.  ? Trouble breathing.  · You need reliever medicines more than 2-3 times a week.  · Your peak flow reading is still at 50-79% of your personal best after following the action plan for 1 hour.  · You have a fever.  Get help right away if:  · You seem to be worse and are not responding to medicine during an asthma attack.  · You are short of breath even at rest.  · You get short of breath when doing very little activity.  · You have trouble eating, drinking, or talking.  · You have chest pain or tightness.  · You have a fast heartbeat.  · Your lips or fingernails start to turn blue.  · You are light-headed or dizzy, or you faint.  · Your peak flow is less than 50% of your personal best.  · You feel too tired to breathe normally.  Summary  · Asthma is a long-term (chronic) condition in which the airways get tight and narrow. An asthma attack can make it hard to breathe.  · Asthma cannot be cured, but medicines and lifestyle changes can help control it.  · Make sure you understand how to avoid triggers and how and when to use your medicines.  This information is not intended to replace advice given to you by your health care provider. Make sure you discuss any questions you have with your health care provider.  Document  Released: 12/02/2007 Document Revised: 07/20/2016 Document Reviewed: 07/20/2016  [employer] Interactive Patient Education © 2019 [employer]

## 2018-10-13 NOTE — Progress Notes (Signed)
Virtual Visit via Video Note    This visit type was conducted due to national recommendations for restrictions regarding the COVID-19 Pandemic (e.g. social distancing).  This format is felt to be most appropriate for this patient at this time.  All issues noted in this document were discussed and addressed.  No physical exam was performed (except for noted visual exam findings with Video Visits).  Please refer to the patient's chart (MyChart message for video visits and phone note for telephone visits) for the patient's consent to telehealth for [facility].  Date:  10/13/2018   ID:  Maria Kline, DOB Aug 10, 1955, MRN [id number]  Patient Location:  Home  Provider location:   Office  Chief Complaint:  Cholesterol check  History of Present Illness:    Maria Kline is a 63 y.o. female who presents via video conferencing for a telehealth visit today.    The patient does not have symptoms concerning for COVID-19 infection (fever, chills, cough, or new shortness of breath).   She presents today for virtual visit, per her request. She was scheduled to come in for a cholesterol check. She reports she has been following the stay at home orders. She reports that she has probably been snacking too much and not exercising enough. She is accustomed to going to the gym.     Past Medical History:  Diagnosis Date  . Anemia    42yrs ago  . Asthma   . Cataracts, bilateral   . Chicken pox   . Complication of anesthesia 2000   c-section; excessive shivering  . Eczema    hx of  . Hx of seasonal allergies   . Hyperlipidemia   . Measles   . Mumps   . Vitamin D deficiency    Vit D on tues and fri   Past Surgical History:  Procedure Laterality Date  . adenodectomy    . CAPSULOTOMY  03/10/2012   Procedure: MINOR CAPSULOTOMY;  Surgeon: Vita Erm., MD;  Location: [facility] OR;  Service: Ophthalmology;  Laterality: Left;  Left Eye  . CATARACT EXTRACTION W/PHACO  11/25/2011   Procedure:  CATARACT EXTRACTION PHACO AND INTRAOCULAR LENS PLACEMENT (IOC);  Surgeon: Shade Flood, MD;  Location: [facility] OR;  Service: Ophthalmology;  Laterality: Left;  . CATARACT EXTRACTION W/PHACO  12/07/2011   Procedure: CATARACT EXTRACTION PHACO AND INTRAOCULAR LENS PLACEMENT (IOC);  Surgeon: Shade Flood, MD;  Location: [facility] OR;  Service: Ophthalmology;  Laterality: Right;  . cataract surgery      left  . CESAREAN SECTION  2000  . COLONOSCOPY    . MYOMECTOMY  1996  . REFRACTIVE SURGERY Bilateral 02/2018   for glaucoma, performed by Dr. Harlon Flor  . TONSILLECTOMY AND ADENOIDECTOMY  1964   as a child  . WISDOM TOOTH EXTRACTION  1970     Current Meds  Medication Sig  . Polyvinyl Alcohol-Povidone (REFRESH OP) Apply to eye.     Allergies:   Demerol [meperidine]; Hydrocodone; and Penicillins   Social History   Tobacco Use  . Smoking status: Never Smoker  . Smokeless tobacco: Never Used  Substance Use Topics  . Alcohol use: Yes    Comment: socially  . Drug use: No     Family Hx: The patient's family history includes Cancer - Colon in her father; Colon cancer in her father; Dementia in her mother; Diabetes in her mother and another family member; GI problems in her mother; Heart disease in an other family member; Hypertension in  her mother and another family member; Hypotension in her mother; Stroke in an other family member. There is no history of Anesthesia problems, Malignant hyperthermia, or Pseudochol deficiency.  ROS:   Please see the history of present illness.    Review of Systems  Constitutional: Negative.   HENT: Positive for congestion.   Respiratory: Positive for cough (she thinks this is due to her allergies/asthma. No fever/chills. needs refill on nebulizer. ).   Cardiovascular: Negative.   Gastrointestinal: Negative.   Neurological: Negative.   Psychiatric/Behavioral: Negative.     All other systems reviewed and are negative.   Labs/Other Tests and Data Reviewed:     Recent Labs: 04/12/2018: BUN 14; Creatinine, Ser 0.74; Hemoglobin 13.1; Platelets 263; Potassium 4.4; Sodium 142 05/20/2018: ALT 36   Recent Lipid Panel Lab Results  Component Value Date/Time   CHOL 243 (A) 08/23/2017   TRIG 179 (A) 08/23/2017   HDL 76 (A) 08/23/2017   LDLCALC 131 08/23/2017    Wt Readings from Last 3 Encounters:  10/13/18 176 lb (79.8 kg)  04/12/18 182 lb 6.4 oz (82.7 kg)  10/26/17 180 lb (81.6 kg)     Exam:    Vital Signs:  Temp 98.9 F (37.2 C)   Ht 5' 2.5" (1.588 m)   Wt 176 lb (79.8 kg)   LMP 09/20/2010   BMI 31.68 kg/m     Physical Exam  Constitutional: She is oriented to person, place, and time and well-developed, well-nourished, and in no distress.  HENT:  Head: Normocephalic and atraumatic.  Neck: Normal range of motion.  Pulmonary/Chest: Effort normal.  Neurological: She is alert and oriented to person, place, and time.  Psychiatric: Affect normal.  Nursing note and vitals reviewed.   ASSESSMENT & PLAN:    1. Pure hypercholesterolemia  She agrees to come in next week for bloodwork. I will make further recommendations once her labs are available for review. She is encouraged to strive for 150 minutes per week for regular exercise.   - Lipid panel; Future  2. Seasonal allergic rhinitis due to pollen  She is encouraged to continue with her current regimen. If persistent, I will consider adding montelukast to her current regimen.   3. Mild intermittent asthma without complication  She will pick up sample of Dulera, . She is instructed to use inhaler, 2 puffs twice daily. She agrees to f/u in four weeks for re-evaluation. She should also rinse her mouth out after each dose.   4. BMI 31/class 1 obesity  She is encouraged to strive for BMI less than 28 to decrease cardiac risk. Again, importance of regular exercise was discussed with the patient.   COVID-19 Education: The signs and symptoms of COVID-19 were discussed with the  patient and how to seek care for testing (follow up with PCP or arrange E-visit).  The importance of social distancing was discussed today.  Patient Risk:   After full review of this patients clinical status, I feel that they are at least moderate risk at this time.  Time:   Today, I have spent 13 minutes/8 sec with the patient with telehealth technology discussing above diagnoses     Medication Adjustments/Labs and Tests Ordered: Current medicines are reviewed at length with the patient today.  Concerns regarding medicines are outlined above.  Tests Ordered: No orders of the defined types were placed in this encounter.  Medication Changes: No orders of the defined types were placed in this encounter.   Disposition:  Follow up in  4 week(s)  Signed, Gwynneth Alimentobyn N Monty Spicher, MD

## 2018-10-17 ENCOUNTER — Other Ambulatory Visit: Payer: BC Managed Care – PPO

## 2018-10-17 ENCOUNTER — Other Ambulatory Visit: Payer: Self-pay

## 2018-10-17 DIAGNOSIS — E7800 Pure hypercholesterolemia, unspecified: Secondary | ICD-10-CM

## 2018-10-18 LAB — LIPID PANEL
Chol/HDL Ratio: 4 ratio (ref 0.0–4.4)
Cholesterol, Total: 243 mg/dL — ABNORMAL HIGH (ref 100–199)
HDL: 61 mg/dL (ref >39)
LDL Calculated: 151 mg/dL — ABNORMAL HIGH (ref 0–99)
Triglycerides: 154 mg/dL — ABNORMAL HIGH (ref 0–149)
VLDL Cholesterol Cal: 31 mg/dL (ref 5–40)

## 2019-04-12 LAB — HM MAMMOGRAPHY

## 2019-04-17 ENCOUNTER — Other Ambulatory Visit: Payer: Self-pay

## 2019-04-17 ENCOUNTER — Other Ambulatory Visit: Payer: BC Managed Care – PPO

## 2019-04-17 DIAGNOSIS — Z0000 Encounter for general adult medical examination without abnormal findings: Secondary | ICD-10-CM

## 2019-04-17 DIAGNOSIS — E559 Vitamin D deficiency, unspecified: Secondary | ICD-10-CM

## 2019-04-17 DIAGNOSIS — E7800 Pure hypercholesterolemia, unspecified: Secondary | ICD-10-CM

## 2019-04-18 ENCOUNTER — Ambulatory Visit (INDEPENDENT_AMBULATORY_CARE_PROVIDER_SITE_OTHER): Payer: BC Managed Care – PPO | Admitting: Internal Medicine

## 2019-04-18 ENCOUNTER — Encounter: Payer: Self-pay | Admitting: Internal Medicine

## 2019-04-18 VITALS — BP 128/72 | HR 72 | Temp 98.4°F | Ht 62.8 in | Wt 169.2 lb

## 2019-04-18 DIAGNOSIS — E7800 Pure hypercholesterolemia, unspecified: Secondary | ICD-10-CM | POA: Diagnosis not present

## 2019-04-18 DIAGNOSIS — E559 Vitamin D deficiency, unspecified: Secondary | ICD-10-CM | POA: Diagnosis not present

## 2019-04-18 DIAGNOSIS — Z23 Encounter for immunization: Secondary | ICD-10-CM

## 2019-04-18 DIAGNOSIS — Z6831 Body mass index (BMI) 31.0-31.9, adult: Secondary | ICD-10-CM

## 2019-04-18 DIAGNOSIS — E6609 Other obesity due to excess calories: Secondary | ICD-10-CM

## 2019-04-18 DIAGNOSIS — Z0000 Encounter for general adult medical examination without abnormal findings: Secondary | ICD-10-CM | POA: Diagnosis not present

## 2019-04-18 LAB — CMP14+EGFR
ALT: 40 IU/L — ABNORMAL HIGH (ref 0–32)
AST: 27 IU/L (ref 0–40)
Albumin/Globulin Ratio: 1.6 (ref 1.2–2.2)
Albumin: 4.4 g/dL (ref 3.8–4.8)
Alkaline Phosphatase: 91 IU/L (ref 39–117)
BUN/Creatinine Ratio: 17 (ref 12–28)
BUN: 12 mg/dL (ref 8–27)
Bilirubin Total: 0.3 mg/dL (ref 0.0–1.2)
CO2: 26 mmol/L (ref 20–29)
Calcium: 9.5 mg/dL (ref 8.7–10.3)
Chloride: 105 mmol/L (ref 96–106)
Creatinine, Ser: 0.71 mg/dL (ref 0.57–1.00)
GFR calc Af Amer: 106 mL/min/{1.73_m2} (ref >59)
GFR calc non Af Amer: 92 mL/min/{1.73_m2} (ref >59)
Globulin, Total: 2.7 g/dL (ref 1.5–4.5)
Glucose: 86 mg/dL (ref 65–99)
Potassium: 4.8 mmol/L (ref 3.5–5.2)
Sodium: 143 mmol/L (ref 134–144)
Total Protein: 7.1 g/dL (ref 6.0–8.5)

## 2019-04-18 LAB — CBC WITH DIFFERENTIAL/PLATELET
Basophils Absolute: 0 10*3/uL (ref 0.0–0.2)
Basos: 1 %
EOS (ABSOLUTE): 0.2 10*3/uL (ref 0.0–0.4)
Eos: 5 %
Hematocrit: 40 % (ref 34.0–46.6)
Hemoglobin: 13.4 g/dL (ref 11.1–15.9)
Immature Grans (Abs): 0 10*3/uL (ref 0.0–0.1)
Immature Granulocytes: 0 %
Lymphocytes Absolute: 1.2 10*3/uL (ref 0.7–3.1)
Lymphs: 37 %
MCH: 28.8 pg (ref 26.6–33.0)
MCHC: 33.5 g/dL (ref 31.5–35.7)
MCV: 86 fL (ref 79–97)
Monocytes Absolute: 0.2 10*3/uL (ref 0.1–0.9)
Monocytes: 6 %
Neutrophils Absolute: 1.7 10*3/uL (ref 1.4–7.0)
Neutrophils: 51 %
Platelets: 251 10*3/uL (ref 150–450)
RBC: 4.66 x10E6/uL (ref 3.77–5.28)
RDW: 13.2 % (ref 11.7–15.4)
WBC: 3.3 10*3/uL — ABNORMAL LOW (ref 3.4–10.8)

## 2019-04-18 LAB — LIPID PANEL
Chol/HDL Ratio: 3.8 ratio (ref 0.0–4.4)
Cholesterol, Total: 251 mg/dL — ABNORMAL HIGH (ref 100–199)
HDL: 66 mg/dL (ref >39)
LDL Chol Calc (NIH): 165 mg/dL — ABNORMAL HIGH (ref 0–99)
Triglycerides: 112 mg/dL (ref 0–149)
VLDL Cholesterol Cal: 20 mg/dL (ref 5–40)

## 2019-04-18 LAB — HEMOGLOBIN A1C
Est. average glucose Bld gHb Est-mCnc: 117 mg/dL
Hgb A1c MFr Bld: 5.7 % — ABNORMAL HIGH (ref 4.8–5.6)

## 2019-04-18 LAB — VITAMIN D 25 HYDROXY (VIT D DEFICIENCY, FRACTURES): Vit D, 25-Hydroxy: 24.8 ng/mL — ABNORMAL LOW (ref 30.0–100.0)

## 2019-04-18 MED ORDER — VITAMIN D (ERGOCALCIFEROL) 1.25 MG (50000 UNIT) PO CAPS: ORAL_CAPSULE | ORAL | 0 refills | Status: DC

## 2019-04-18 MED ORDER — PREVNAR 13 IM SUSP: 0.5000 mL | INTRAMUSCULAR | 0 refills | Status: AC

## 2019-04-18 NOTE — Patient Instructions (Signed)
Health Maintenance, Female Adopting a healthy lifestyle and getting preventive care are important in promoting health and wellness. Ask your health care provider about:  The right schedule for you to have regular tests and exams.  Things you can do on your own to prevent diseases and keep yourself healthy. What should I know about diet, weight, and exercise? Eat a healthy diet   Eat a diet that includes plenty of vegetables, fruits, low-fat dairy products, and lean protein.  Do not eat a lot of foods that are high in solid fats, added sugars, or sodium. Maintain a healthy weight Body mass index (BMI) is used to identify weight problems. It estimates body fat based on height and weight. Your health care provider can help determine your BMI and help you achieve or maintain a healthy weight. Get regular exercise Get regular exercise. This is one of the most important things you can do for your health. Most adults should:  Exercise for at least 150 minutes each week. The exercise should increase your heart rate and make you sweat (moderate-intensity exercise).  Do strengthening exercises at least twice a week. This is in addition to the moderate-intensity exercise.  Spend less time sitting. Even light physical activity can be beneficial. Watch cholesterol and blood lipids Have your blood tested for lipids and cholesterol at 61 years of age, then have this test every 5 years. Have your cholesterol levels checked more often if:  Your lipid or cholesterol levels are high.  You are older than 61 years of age.  You are at high risk for heart disease. What should I know about cancer screening? Depending on your health history and family history, you may need to have cancer screening at various ages. This may include screening for:  Breast cancer.  Cervical cancer.  Colorectal cancer.  Skin cancer.  Lung cancer. What should I know about heart disease, diabetes, and high blood  pressure? Blood pressure and heart disease  High blood pressure causes heart disease and increases the risk of stroke. This is more likely to develop in people who have high blood pressure readings, are of African descent, or are overweight.  Have your blood pressure checked: ? Every 3-5 years if you are 18-39 years of age. ? Every year if you are 40 years old or older. Diabetes Have regular diabetes screenings. This checks your fasting blood sugar level. Have the screening done:  Once every three years after age 40 if you are at a normal weight and have a low risk for diabetes.  More often and at a younger age if you are overweight or have a high risk for diabetes. What should I know about preventing infection? Hepatitis B If you have a higher risk for hepatitis B, you should be screened for this virus. Talk with your health care provider to find out if you are at risk for hepatitis B infection. Hepatitis C Testing is recommended for:  Everyone born from 1945 through 1965.  Anyone with known risk factors for hepatitis C. Sexually transmitted infections (STIs)  Get screened for STIs, including gonorrhea and chlamydia, if: ? You are sexually active and are younger than 61 years of age. ? You are older than 61 years of age and your health care provider tells you that you are at risk for this type of infection. ? Your sexual activity has changed since you were last screened, and you are at increased risk for chlamydia or gonorrhea. Ask your health care provider if   you are at risk.  Ask your health care provider about whether you are at high risk for HIV. Your health care provider may recommend a prescription medicine to help prevent HIV infection. If you choose to take medicine to prevent HIV, you should first get tested for HIV. You should then be tested every 3 months for as long as you are taking the medicine. Pregnancy  If you are about to stop having your period (premenopausal) and  you may become pregnant, seek counseling before you get pregnant.  Take 400 to 800 micrograms (mcg) of folic acid every day if you become pregnant.  Ask for birth control (contraception) if you want to prevent pregnancy. Osteoporosis and menopause Osteoporosis is a disease in which the bones lose minerals and strength with aging. This can result in bone fractures. If you are 65 years old or older, or if you are at risk for osteoporosis and fractures, ask your health care provider if you should:  Be screened for bone loss.  Take a calcium or vitamin D supplement to lower your risk of fractures.  Be given hormone replacement therapy (HRT) to treat symptoms of menopause. Follow these instructions at home: Lifestyle  Do not use any products that contain nicotine or tobacco, such as cigarettes, e-cigarettes, and chewing tobacco. If you need help quitting, ask your health care provider.  Do not use street drugs.  Do not share needles.  Ask your health care provider for help if you need support or information about quitting drugs. Alcohol use  Do not drink alcohol if: ? Your health care provider tells you not to drink. ? You are pregnant, may be pregnant, or are planning to become pregnant.  If you drink alcohol: ? Limit how much you use to 0-1 drink a day. ? Limit intake if you are breastfeeding.  Be aware of how much alcohol is in your drink. In the [place] one drink equals one 12 oz bottle of beer (355 mL), one 5 oz glass of wine (148 mL), or one 1 oz glass of hard liquor (44 mL). General instructions  Schedule regular health, dental, and eye exams.  Stay current with your vaccines.  Tell your health care provider if: ? You often feel depressed. ? You have ever been abused or do not feel safe at home. Summary  Adopting a healthy lifestyle and getting preventive care are important in promoting health and wellness.  Follow your health care provider's instructions about healthy  diet, exercising, and getting tested or screened for diseases.  Follow your health care provider's instructions on monitoring your cholesterol and blood pressure. This information is not intended to replace advice given to you by your health care provider. Make sure you discuss any questions you have with your health care provider. Document Released: 12/29/2010 Document Revised: 06/08/2018 Document Reviewed: 06/08/2018 Elsevier Patient Education  2020 [employer] Inc[employer]  

## 2019-04-18 NOTE — Progress Notes (Signed)
Subjective:     Patient ID: Maria Kline , female    DOB: 09/06/1955 , 63 y.o.   MRN: [id number]   Chief Complaint  Patient presents with  . Annual Exam    HPI  She is here today for a full physical examination. She is followed by Dr. Su Hiltoberts for her GYN evaluations.  She reports her last visit was 2 weeks ago.     Past Medical History:  Diagnosis Date  . Anemia    6951yrs ago  . Asthma   . Cataracts, bilateral   . Chicken pox   . Complication of anesthesia 2000   c-section; excessive shivering  . Eczema    hx of  . Hx of seasonal allergies   . Hyperlipidemia   . Measles   . Mumps   . Vitamin D deficiency    Vit D on tues and fri     Family History  Problem Relation Age of Onset  . Hypotension Mother   . Hypertension Mother   . Diabetes Mother   . Dementia Mother   . GI problems Mother   . Diabetes Other   . Stroke Other   . Hypertension Other   . Heart disease Other   . Colon cancer Father   . Cancer - Colon Father   . Anesthesia problems Neg Hx   . Malignant hyperthermia Neg Hx   . Pseudochol deficiency Neg Hx      Current Outpatient Medications:  .  albuterol (VENTOLIN HFA) 108 (90 Base) MCG/ACT inhaler, Inhale 2 puffs into the lungs every 6 (six) hours as needed for wheezing or shortness of breath., Disp: 1 Inhaler, Rfl: 2 .  bimatoprost (LUMIGAN) 0.03 % ophthalmic solution, 1 drop at bedtime., Disp: , Rfl:  .  mometasone-formoterol (DULERA) 100-5 MCG/ACT AERO, Inhale 2 puffs into the lungs 2 (two) times daily., Disp: , Rfl:  .  Polyvinyl Alcohol-Povidone (REFRESH OP), Apply to eye., Disp: , Rfl:    Allergies  Allergen Reactions  . Demerol [Meperidine] Itching  . Hydrocodone Other (See Comments)    Patient states that she is not allergic to this medication  . Penicillins Other (See Comments)    Childhood reaction      The patient states she uses post menopausal status for birth control. Last LMP was Patient's last menstrual period was  09/20/2010.. Negative for Dysmenorrhea Negative for: breast discharge, breast lump(s), breast pain and breast self exam. Associated symptoms include abnormal vaginal bleeding. Pertinent negatives include abnormal bleeding (hematology), anxiety, decreased libido, depression, difficulty falling sleep, dyspareunia, history of infertility, nocturia, sexual dysfunction, sleep disturbances, urinary incontinence, urinary urgency, vaginal discharge and vaginal itching. Diet regular.The patient states her exercise level is  moderate.   . The patient's tobacco use is:  Social History   Tobacco Use  Smoking Status Never Smoker  Smokeless Tobacco Never Used  . She has been exposed to passive smoke. The patient's alcohol use is:  Social History   Substance and Sexual Activity  Alcohol Use Yes   Comment: socially  . Additional information: Last pap 01/2018, next one scheduled for 2022.   Review of Systems  Constitutional: Negative.   HENT: Negative.   Eyes: Negative.   Respiratory: Negative.   Cardiovascular: Negative.   Endocrine: Negative.   Genitourinary: Negative.   Musculoskeletal: Negative.   Skin: Negative.   Allergic/Immunologic: Negative.   Neurological: Negative.   Hematological: Negative.   Psychiatric/Behavioral: Negative.      Today's Vitals  04/18/19 0859  BP: 128/72  Pulse: 72  Temp: 98.4 F (36.9 C)  TempSrc: Oral  SpO2: 98%  Weight: 169 lb 3.2 oz (76.7 kg)  Height: 5' 2.8" (1.595 m)   Body mass index is 30.16 kg/m.   Objective:  Physical Exam Vitals signs and nursing note reviewed.  Constitutional:      Appearance: Normal appearance. She is obese.  HENT:     Head: Normocephalic and atraumatic.     Right Ear: Tympanic membrane, ear canal and external ear normal.     Left Ear: Tympanic membrane, ear canal and external ear normal.     Nose:     Comments: Deferred, masked.     Mouth/Throat:     Comments: Deferred, masked Eyes:     Extraocular Movements:  Extraocular movements intact.     Conjunctiva/sclera: Conjunctivae normal.     Pupils: Pupils are equal, round, and reactive to light.  Neck:     Musculoskeletal: Normal range of motion and neck supple.  Cardiovascular:     Rate and Rhythm: Normal rate and regular rhythm.     Pulses: Normal pulses.     Heart sounds: Normal heart sounds.  Pulmonary:     Effort: Pulmonary effort is normal.     Breath sounds: Normal breath sounds.  Chest:     Breasts: Tanner Score is 5.        Right: Normal.        Left: Normal.  Abdominal:     General: Abdomen is flat. Bowel sounds are normal.     Palpations: Abdomen is soft.  Genitourinary:    Comments: deferred Musculoskeletal: Normal range of motion.  Skin:    General: Skin is warm and dry.  Neurological:     General: No focal deficit present.     Mental Status: She is alert and oriented to person, place, and time.  Psychiatric:        Mood and Affect: Mood normal.        Behavior: Behavior normal.         Assessment And Plan:     1. Encounter for annual physical exam  A full exam was performed.  Importance of monthly self breast exams was discussed with the patient. She had her labs drawn a week ago, so we discussed her lab results in full detail. PATIENT HAS BEEN ADVISED TO GET 30-45 MINUTES REGULAR EXERCISE NO LESS THAN FOUR TO FIVE DAYS PER WEEK - BOTH WEIGHTBEARING EXERCISES AND AEROBIC ARE RECOMMENDED.  SHE WAS ADVISED TO FOLLOW A HEALTHY DIET WITH AT LEAST SIX FRUITS/VEGGIES PER DAY, DECREASE INTAKE OF RED MEAT, AND TO INCREASE FISH INTAKE TO TWO DAYS PER WEEK.  MEATS/FISH SHOULD NOT BE FRIED, BAKED OR BROILED IS PREFERABLE.  I SUGGEST WEARING SPF 50 SUNSCREEN ON EXPOSED PARTS AND ESPECIALLY WHEN IN THE DIRECT SUNLIGHT FOR AN EXTENDED PERIOD OF TIME.  PLEASE AVOID FAST FOOD RESTAURANTS AND INCREASE YOUR WATER INTAKE.   2. Pure hypercholesterolemia  Chronic. She does not wish to take rx meds. Again, labs drawn a week ago, so we went  over her results in full detail. Her LDL is 165, up from last year. She is encouraged to aim for 150 minutes of exercise per week, resume her fiber intake and to avoid refined carbs. She will rto in four to six months for NMR Liposcience with graph. Pt advised this will not only give information regarding her lipid numbers, but will offer information regarding particle number  and size. She is in agreement with current plan. If needed at that time, will consider berberine supplementation. Greater than 50% of face to face counseling was discussed with the patient in full detail. All questions were answered to her satisfaction.   3. Vitamin D deficiency  She was given rx vitamin D to take twice weekly for now. She is advised to notify me once she is done with this initial rx.   4. Need for influenza vaccination  - Flu Vaccine QUAD 6+ mos PF IM (Fluarix Quad PF)  5. Class 1 obesity due to excess calories without serious comorbidity with body mass index (BMI) of 30.0 to 30.9 in adult  She was congratulated on her weight loss thus far.  She is encouraged to continue with her regular exercise regimen, with a goal of aiming for 150 minutes per week minimally.          [name], MD    THE PATIENT IS ENCOURAGED TO PRACTICE SOCIAL DISTANCING DUE TO THE COVID-19 PANDEMIC.

## 2019-04-19 ENCOUNTER — Encounter: Payer: Self-pay | Admitting: Internal Medicine

## 2019-05-12 ENCOUNTER — Encounter: Payer: Self-pay | Admitting: Internal Medicine

## 2019-07-10 ENCOUNTER — Other Ambulatory Visit: Payer: BC Managed Care – PPO

## 2019-07-10 ENCOUNTER — Other Ambulatory Visit: Payer: Self-pay | Admitting: Internal Medicine

## 2019-07-10 ENCOUNTER — Other Ambulatory Visit: Payer: Self-pay

## 2019-07-10 DIAGNOSIS — E7800 Pure hypercholesterolemia, unspecified: Secondary | ICD-10-CM

## 2019-07-11 LAB — LIPID PANEL
Chol/HDL Ratio: 3.3 ratio (ref 0.0–4.4)
Cholesterol, Total: 234 mg/dL — ABNORMAL HIGH (ref 100–199)
HDL: 71 mg/dL (ref >39)
LDL Chol Calc (NIH): 141 mg/dL — ABNORMAL HIGH (ref 0–99)
Triglycerides: 128 mg/dL (ref 0–149)
VLDL Cholesterol Cal: 22 mg/dL (ref 5–40)

## 2019-07-18 ENCOUNTER — Telehealth (INDEPENDENT_AMBULATORY_CARE_PROVIDER_SITE_OTHER): Payer: BC Managed Care – PPO | Admitting: Internal Medicine

## 2019-07-18 ENCOUNTER — Other Ambulatory Visit: Payer: Self-pay

## 2019-07-18 ENCOUNTER — Encounter: Payer: Self-pay | Admitting: Internal Medicine

## 2019-07-18 DIAGNOSIS — Z20822 Contact with and (suspected) exposure to covid-19: Secondary | ICD-10-CM

## 2019-07-18 DIAGNOSIS — J4521 Mild intermittent asthma with (acute) exacerbation: Secondary | ICD-10-CM

## 2019-07-18 DIAGNOSIS — Z712 Person consulting for explanation of examination or test findings: Secondary | ICD-10-CM

## 2019-07-18 DIAGNOSIS — F4321 Adjustment disorder with depressed mood: Secondary | ICD-10-CM

## 2019-07-18 DIAGNOSIS — E7800 Pure hypercholesterolemia, unspecified: Secondary | ICD-10-CM | POA: Diagnosis not present

## 2019-07-18 MED ORDER — ALBUTEROL SULFATE HFA 108 (90 BASE) MCG/ACT IN AERS: 2.0000 | INHALATION_SPRAY | Freq: Four times a day (QID) | RESPIRATORY_TRACT | 5 refills | Status: DC | PRN

## 2019-07-18 MED ORDER — PREDNISONE 10 MG (21) PO TBPK: ORAL_TABLET | ORAL | 0 refills | Status: DC

## 2019-07-18 NOTE — Progress Notes (Signed)
Virtual Visit via Video   This visit type was conducted due to national recommendations for restrictions regarding the COVID-19 Pandemic (e.g. social distancing) in an effort to limit this patient's exposure and mitigate transmission in our community.  Due to her co-morbid illnesses, this patient is at least at moderate risk for complications without adequate follow up.  This format is felt to be most appropriate for this patient at this time.  All issues noted in this document were discussed and addressed.  A limited physical exam was performed with this format.    This visit type was conducted due to national recommendations for restrictions regarding the COVID-19 Pandemic (e.g. social distancing) in an effort to limit this patient's exposure and mitigate transmission in our community.  Patients identity confirmed using two different identifiers.  This format is felt to be most appropriate for this patient at this time.  All issues noted in this document were discussed and addressed.  No physical exam was performed (except for noted visual exam findings with Video Visits).    Date:  07/18/2019   ID:  Maria Kline, DOB 1956/05/02, MRN [id number]  Patient Location:  Home  Provider location:   Office    Chief Complaint:  "I have been exposed to COVID"  History of Present Illness:    Maria Kline is a 64 y.o. female who presents via video conferencing for a telehealth visit today.    The patient does have symptoms concerning for COVID-19 infection (fever, chills, cough, or new shortness of breath).   She presents today for virtual visit. She prefers this method of contact due to COVID-19 pandemic. She reports that unfortunately, her Mother has passed since her last visit. She lived in a nursing facility contracted COVID, and unfortunately had complications. She reports her brother and sister-in-law came to town on January 4th and stayed until the 13th (both had tested negative  for COVID before arriving). They left on 1/13 and on 1/15 she tested positive for COVID. Patient has since developed cough/wheezing which she attributed to her usual "winter asthma" symptoms. She denies fever/chills. She has had one negative test on 1/14. She plans to be retested this week.     Past Medical History:  Diagnosis Date  . Anemia    71yrs ago  . Asthma   . Cataracts, bilateral   . Chicken pox   . Complication of anesthesia 2000   c-section; excessive shivering  . Eczema    hx of  . Hx of seasonal allergies   . Hyperlipidemia   . Measles   . Mumps   . Vitamin D deficiency    Vit D on tues and fri   Past Surgical History:  Procedure Laterality Date  . adenodectomy    . CAPSULOTOMY  03/10/2012   Procedure: MINOR CAPSULOTOMY;  Surgeon: Vita Erm., MD;  Location: [facility] OR;  Service: Ophthalmology;  Laterality: Left;  Left Eye  . CATARACT EXTRACTION W/PHACO  11/25/2011   Procedure: CATARACT EXTRACTION PHACO AND INTRAOCULAR LENS PLACEMENT (IOC);  Surgeon: Shade Flood, MD;  Location: [facility] OR;  Service: Ophthalmology;  Laterality: Left;  . CATARACT EXTRACTION W/PHACO  12/07/2011   Procedure: CATARACT EXTRACTION PHACO AND INTRAOCULAR LENS PLACEMENT (IOC);  Surgeon: Shade Flood, MD;  Location: [facility] OR;  Service: Ophthalmology;  Laterality: Right;  . cataract surgery      left  . CESAREAN SECTION  2000  . COLONOSCOPY    . MYOMECTOMY  1996  .  REFRACTIVE SURGERY Bilateral 02/2018   for glaucoma, performed by Dr. Venetia Maxon  . [place]   as a child  . [place]     No outpatient medications have been marked as taking for the 07/18/19 encounter (Telemedicine) with Glendale Chard, MD.     Allergies:   Demerol [meperidine], Hydrocodone, and Penicillins   Social History   Tobacco Use  . Smoking status: Never Smoker  . Smokeless tobacco: Never Used  Substance Use Topics  . Alcohol use: Yes    Comment: socially  . Drug  use: No     Family Hx: The patient's family history includes Cancer - Colon in her father; Colon cancer in her father; Dementia in her mother; Diabetes in her mother and another family member; GI problems in her mother; Heart disease in an other family member; Hypertension in her mother and another family member; Hypotension in her mother; Stroke in an other family member. There is no history of Anesthesia problems, Malignant hyperthermia, or Pseudochol deficiency.  ROS:   Please see the history of present illness.    Review of Systems  Constitutional: Negative.   Respiratory: Positive for cough and wheezing.   Cardiovascular: Negative.   Gastrointestinal: Negative.   Neurological: Negative.   Psychiatric/Behavioral: Negative.     All other systems reviewed and are negative.   Labs/Other Tests and Data Reviewed:    Recent Labs: 04/17/2019: ALT 40; BUN 12; Creatinine, Ser 0.71; Hemoglobin 13.4; Platelets 251; Potassium 4.8; Sodium 143   Recent Lipid Panel Lab Results  Component Value Date/Time   CHOL 234 (H) 07/10/2019 10:29 AM   TRIG 128 07/10/2019 10:29 AM   HDL 71 07/10/2019 10:29 AM   CHOLHDL 3.3 07/10/2019 10:29 AM   LDLCALC 141 (H) 07/10/2019 10:29 AM    Wt Readings from Last 3 Encounters:  04/18/19 169 lb 3.2 oz (76.7 kg)  10/13/18 176 lb (79.8 kg)  04/12/18 182 lb 6.4 oz (82.7 kg)     Exam:    Vital Signs:  LMP 09/20/2010     Physical Exam  Constitutional: She is oriented to person, place, and time and well-developed, well-nourished, and in no distress.  HENT:  Head: Normocephalic and atraumatic.  Pulmonary/Chest: Effort normal.  Musculoskeletal:     Cervical back: Normal range of motion.  Neurological: She is alert and oriented to person, place, and time.  Psychiatric: Affect normal.  Nursing note and vitals reviewed.   ASSESSMENT & PLAN:     1. Mild intermittent asthmatic bronchitis with acute exacerbation  She was given refill of albuterol  inhaler to use prn. She was also given rx prednisone 10mg  six day dose pack. She is encouraged to notify me ASAP if she develops worsening SOB. She is also advised to monitor her temperature daily.   2. Pure hypercholesterolemia  Chronic, improved. Her lab results from 1/11 were reviewed in full detail, LDL now 141. She will continue with berberine. She will rto in four months for re-evaluation. I will consider adding red yeast rice at that time if needed.   3. Grief  She is encouraged to contact Hospice for grief counseling. She agrees to do so, just not at this time. She is encouraged to contact me if she needs anything in the future.   4. Close exposure to COVID-19 virus  She plans to go for repeat COVID testing by Thursday. She is advised to continue to self-isolate until she has her results.  COVID-19 Education: The signs and symptoms of COVID-19 were discussed with the patient and how to seek care for testing (follow up with PCP or arrange E-visit).  The importance of social distancing was discussed today.  Patient Risk:   After full review of this patients clinical status, I feel that they are at least moderate risk at this time.   Medication Adjustments/Labs and Tests Ordered: Current medicines are reviewed at length with the patient today.  Concerns regarding medicines are outlined above.   Tests Ordered: No orders of the defined types were placed in this encounter.   Medication Changes: Meds ordered this encounter  Medications  . albuterol (VENTOLIN HFA) 108 (90 Base) MCG/ACT inhaler    Sig: Inhale 2 puffs into the lungs every 6 (six) hours as needed for wheezing or shortness of breath.    Dispense:  18 g    Refill:  5  . predniSONE (STERAPRED UNI-PAK 21 TAB) 10 MG (21) TBPK tablet    Sig: Please dispense as six-day dose pack, use as directed    Dispense:  21 tablet    Refill:  0    Disposition:  Follow up in 4 month(s)  Signed, [name], MD

## 2019-07-18 NOTE — Patient Instructions (Signed)
COVID-19 Vaccine Information can be found at: [email or web address] For questions related to vaccine distribution or appointments, please email [email or web address] or call [phone].    COVID-19 Frequently Asked Questions COVID-19 (coronavirus disease) is an infection that is caused by a large family of viruses. Some viruses cause illness in people and others cause illness in animals like camels, cats, and bats. In some cases, the viruses that cause illness in animals can spread to humans. Where did the coronavirus come from? In December 2019, [place] told the [employer] [facility]) of several cases of lung disease (human respiratory illness). These cases were linked to an open seafood and livestock market in the city of [place]. The link to the seafood and livestock market suggests that the virus may have spread from animals to humans. However, since that first outbreak in December, the virus has also been shown to spread from person to person. What is the name of the disease and the virus? Disease name Early on, this disease was called novel coronavirus. This is because scientists determined that the disease was caused by a new (novel) respiratory virus. The World Health Organization [facility]) has now named the disease COVID-19, or coronavirus disease. Virus name The virus that causes the disease is called severe acute respiratory syndrome coronavirus 2 (SARS-CoV-2). More information on disease and virus naming World Health Organization [facility]): www.who.int/emergencies/diseases/novel-coronavirus-2019/technical-guidance/naming-the-coronavirus-disease-(covid-2019)-and-the-virus-that-causes-it Who is at risk for complications from coronavirus disease? Some people may be at higher risk for complications from coronavirus disease. This includes older adults and people who have chronic diseases, such as heart disease, diabetes, and lung  disease. If you are at higher risk for complications, take these extra precautions:  Stay home as much as possible.  Avoid social gatherings and travel.  Avoid close contact with others. Stay at least 6 ft (2 m) away from others, if possible.  Wash your hands often with soap and water for at least 20 seconds.  Avoid touching your face, mouth, nose, or eyes.  Keep supplies on hand at home, such as food, medicine, and cleaning supplies.  If you must go out in public, wear a cloth face covering or face mask. Make sure your mask covers your nose and mouth. How does coronavirus disease spread? The virus that causes coronavirus disease spreads easily from person to person (is contagious). You may catch the virus by:  Breathing in droplets from an infected person. Droplets can be spread by a person breathing, speaking, singing, coughing, or sneezing.  Touching something, like a table or a doorknob, that was exposed to the virus (contaminated) and then touching your mouth, nose, or eyes. Can I get the virus from touching surfaces or objects? There is still a lot that we do not know about the virus that causes coronavirus disease. Scientists are basing a lot of information on what they know about similar viruses, such as:  Viruses cannot generally survive on surfaces for long. They need a human body (host) to survive.  It is more likely that the virus is spread by close contact with people who are sick (direct contact), such as through: ? Shaking hands or hugging. ? Breathing in respiratory droplets that travel through the air. Droplets can be spread by a person breathing, speaking, singing, coughing, or sneezing.  It is less likely that the virus is spread when a person touches a surface or object that has the virus on it (indirect contact). The virus may be able to enter the body if the person touches a surface  or object and then touches his or her face, eyes, nose, or mouth. Can a person  spread the virus without having symptoms of the disease? It may be possible for the virus to spread before a person has symptoms of the disease, but this is most likely not the main way the virus is spreading. It is more likely for the virus to spread by being in close contact with people who are sick and breathing in the respiratory droplets spread by a person breathing, speaking, singing, coughing, or sneezing. What are the symptoms of coronavirus disease? Symptoms vary from person to person and can range from mild to severe. Symptoms may include:  Fever or chills.  Cough.  Difficulty breathing or feeling short of breath.  Headaches, body aches, or muscle aches.  Runny or stuffy (congested) nose.  Sore throat.  New loss of taste or smell.  Nausea, vomiting, or diarrhea. These symptoms can appear anywhere from 2 to 14 days after you have been exposed to the virus. Some people may not have any symptoms. If you develop symptoms, call your health care provider. People with severe symptoms may need hospital care. Should I be tested for this virus? Your health care provider will decide whether to test you based on your symptoms, history of exposure, and your risk factors. How does a health care provider test for this virus? Health care providers will collect samples to send for testing. Samples may include:  Taking a swab of fluid from the back of your nose and throat, your nose, or your throat.  Taking fluid from the lungs by having you cough up mucus (sputum) into a sterile cup.  Taking a blood sample. Is there a treatment or vaccine for this virus? Currently, there is no vaccine to prevent coronavirus disease. Also, there are no medicines like antibiotics or antivirals to treat the virus. A person who becomes sick is given supportive care, which means rest and fluids. A person may also relieve his or her symptoms by using over-the-counter medicines that treat sneezing, coughing, and  runny nose. These are the same medicines that a person takes for the common cold. If you develop symptoms, call your health care provider. People with severe symptoms may need hospital care. What can I do to protect myself and my family from this virus?     You can protect yourself and your family by taking the same actions that you would take to prevent the spread of other viruses. Take the following actions:  Wash your hands often with soap and water for at least 20 seconds. If soap and water are not available, use alcohol-based hand sanitizer.  Avoid touching your face, mouth, nose, or eyes.  Cough or sneeze into a tissue, sleeve, or elbow. Do not cough or sneeze into your hand or the air. ? If you cough or sneeze into a tissue, throw it away immediately and wash your hands.  Disinfect objects and surfaces that you frequently touch every day.  Stay away from people who are sick.  Avoid going out in public, follow guidance from your state and local health authorities.  Avoid crowded indoor spaces. Stay at least 6 ft (2 m) away from others.  If you must go out in public, wear a cloth face covering or face mask. Make sure your mask covers your nose and mouth.  Stay home if you are sick, except to get medical care. Call your health care provider before you get medical care. Your health  care provider will tell you how long to stay home.  Make sure your vaccines are up to date. Ask your health care provider what vaccines you need. What should I do if I need to travel? Follow travel recommendations from your local health authority, the CDC, and WHO. Travel information and advice  Centers for Disease Control and Prevention (CDC): BodyEditor.hu  World Health Organization [facility]): ThirdIncome.ca Know the risks and take action to protect your health  You are at higher risk of getting coronavirus  disease if you are traveling to areas with an outbreak or if you are exposed to travelers from areas with an outbreak.  Wash your hands often and practice good hygiene to lower the risk of catching or spreading the virus. What should I do if I am sick? General instructions to stop the spread of infection  Wash your hands often with soap and water for at least 20 seconds. If soap and water are not available, use alcohol-based hand sanitizer.  Cough or sneeze into a tissue, sleeve, or elbow. Do not cough or sneeze into your hand or the air.  If you cough or sneeze into a tissue, throw it away immediately and wash your hands.  Stay home unless you must get medical care. Call your health care provider or local health authority before you get medical care.  Avoid public areas. Do not take public transportation, if possible.  If you can, wear a mask if you must go out of the house or if you are in close contact with someone who is not sick. Make sure your mask covers your nose and mouth. Keep your home clean  Disinfect objects and surfaces that are frequently touched every day. This may include: ? Counters and tables. ? Doorknobs and light switches. ? Sinks and faucets. ? Electronics such as phones, remote controls, keyboards, computers, and tablets.  Wash dishes in hot, soapy water or use a dishwasher. Air-dry your dishes.  Wash laundry in hot water. Prevent infecting other household members  Let healthy household members care for children and pets, if possible. If you have to care for children or pets, wash your hands often and wear a mask.  Sleep in a different bedroom or bed, if possible.  Do not share personal items, such as razors, toothbrushes, deodorant, combs, brushes, towels, and washcloths. Where to find more information Centers for Disease Control and Prevention (CDC)  Information and news updates: https://www.butler-gonzalez.com/ World Health Organization  [facility])  Information and news updates: MissExecutive.com.ee  Coronavirus health topic: https://www.castaneda.info/  Questions and answers on COVID-19: OpportunityDebt.at  Global tracker: who.sprinklr.com American Academy of Pediatrics (AAP)  Information for families: www.healthychildren.org/English/health-issues/conditions/chest-lungs/Pages/2019-Novel-Coronavirus.aspx The coronavirus situation is changing rapidly. Check your local health authority website or the CDC and [employer] [facility] websites for updates and news. When should I contact a health care provider?  Contact your health care provider if you have symptoms of an infection, such as fever or cough, and you: ? Have been near anyone who is known to have coronavirus disease. ? Have come into contact with a person who is suspected to have coronavirus disease. ? Have traveled to an area where there is an outbreak of COVID-19. When should I get emergency medical care?  Get help right away by calling your local emergency services (911 in the [place] if you have: ? Trouble breathing. ? Pain or pressure in your chest. ? Confusion. ? Blue-tinged lips and fingernails. ? Difficulty waking from sleep. ? Symptoms that get worse. Let the  emergency medical personnel know if you think you have coronavirus disease. Summary  A new respiratory virus is spreading from person to person and causing COVID-19 (coronavirus disease).  The virus that causes COVID-19 appears to spread easily. It spreads from one person to another through droplets from breathing, speaking, singing, coughing, or sneezing.  Older adults and those with chronic diseases are at higher risk of disease. If you are at higher risk for complications, take extra precautions.  There is currently no vaccine to prevent coronavirus disease. There are no medicines, such as antibiotics or antivirals, to treat the  virus.  You can protect yourself and your family by washing your hands often, avoiding touching your face, and covering your coughs and sneezes. This information is not intended to replace advice given to you by your health care provider. Make sure you discuss any questions you have with your health care provider. Document Revised: 04/14/2019 Document Reviewed: 10/11/2018 Elsevier Patient Education  2020 [employer].

## 2019-11-06 ENCOUNTER — Encounter: Payer: Self-pay | Admitting: Internal Medicine

## 2019-12-06 ENCOUNTER — Other Ambulatory Visit: Payer: Self-pay

## 2019-12-06 ENCOUNTER — Ambulatory Visit (INDEPENDENT_AMBULATORY_CARE_PROVIDER_SITE_OTHER): Payer: 59 | Admitting: Internal Medicine

## 2019-12-06 ENCOUNTER — Encounter: Payer: Self-pay | Admitting: Internal Medicine

## 2019-12-06 VITALS — BP 132/78 | HR 75 | Temp 98.2°F | Ht 62.8 in | Wt 175.2 lb

## 2019-12-06 DIAGNOSIS — J4520 Mild intermittent asthma, uncomplicated: Secondary | ICD-10-CM | POA: Diagnosis not present

## 2019-12-06 DIAGNOSIS — E559 Vitamin D deficiency, unspecified: Secondary | ICD-10-CM | POA: Diagnosis not present

## 2019-12-06 DIAGNOSIS — E6609 Other obesity due to excess calories: Secondary | ICD-10-CM

## 2019-12-06 DIAGNOSIS — H0520 Unspecified exophthalmos: Secondary | ICD-10-CM | POA: Diagnosis not present

## 2019-12-06 DIAGNOSIS — E7800 Pure hypercholesterolemia, unspecified: Secondary | ICD-10-CM

## 2019-12-06 DIAGNOSIS — Z6831 Body mass index (BMI) 31.0-31.9, adult: Secondary | ICD-10-CM

## 2019-12-06 DIAGNOSIS — Z23 Encounter for immunization: Secondary | ICD-10-CM

## 2019-12-06 MED ORDER — TETANUS-DIPHTH-ACELL PERTUSSIS 5-2.5-18.5 LF-MCG/0.5 IM SUSP
0.5000 mL | Freq: Once | INTRAMUSCULAR | Status: AC
  Administered 2019-12-06: 0.5 mL via INTRAMUSCULAR

## 2019-12-06 NOTE — Progress Notes (Signed)
This visit occurred during the SARS-CoV-2 public health emergency.  Safety protocols were in place, including screening questions prior to the visit, additional usage of staff PPE, and extensive cleaning of exam room while observing appropriate contact time as indicated for disinfecting solutions.  Subjective:     Patient ID: Maria Kline , female    DOB: 08-03-1955 , 64 y.o.   MRN: [id number]   Chief Complaint  Patient presents with  . Hyperlipidemia  . Vitamin d f/u    HPI  She is here today for a cholesterol check. She does not wish to take a rx medication for high cholesterol. She is also no longer taking berberine. She has switched from Benefiber to Metamucil. She also reports exercising on a regular basis. She would also like to check her vitamin D level.     Past Medical History:  Diagnosis Date  . Anemia    56yrs ago  . Asthma   . Cataracts, bilateral   . Chicken pox   . Complication of anesthesia 2000   c-section; excessive shivering  . Eczema    hx of  . Hx of seasonal allergies   . Hyperlipidemia   . Measles   . Mumps   . Vitamin D deficiency    Vit D on tues and fri     Family History  Problem Relation Age of Onset  . Hypotension Mother   . Hypertension Mother   . Diabetes Mother   . Dementia Mother   . GI problems Mother   . Diabetes Other   . Stroke Other   . Hypertension Other   . Heart disease Other   . Colon cancer Father   . Cancer - Colon Father   . Anesthesia problems Neg Hx   . Malignant hyperthermia Neg Hx   . Pseudochol deficiency Neg Hx      Current Outpatient Medications:  .  albuterol (VENTOLIN HFA) 108 (90 Base) MCG/ACT inhaler, Inhale 2 puffs into the lungs every 6 (six) hours as needed for wheezing or shortness of breath., Disp: 18 g, Rfl: 5 .  bimatoprost (LUMIGAN) 0.03 % ophthalmic solution, 1 drop at bedtime., Disp: , Rfl:  .  mometasone-formoterol (DULERA) 100-5 MCG/ACT AERO, Inhale 2 puffs into the lungs 2 (two) times  daily., Disp: , Rfl:  .  Polyvinyl Alcohol-Povidone (REFRESH OP), Apply to eye., Disp: , Rfl:  .  Vitamin D, Ergocalciferol, (DRISDOL) 1.25 MG (50000 UT) CAPS capsule, One capsule po twice weekly on Tues/Fridays (Patient not taking: Reported on 12/06/2019), Disp: 24 capsule, Rfl: 0  Current Facility-Administered Medications:  .  Tdap (BOOSTRIX) injection 0.5 mL, 0.5 mL, Intramuscular, Once, Glendale Chard, MD   Allergies  Allergen Reactions  . Demerol [Meperidine] Itching  . Hydrocodone Other (See Comments)    Patient states that she is not allergic to this medication  . Penicillins Other (See Comments)    Childhood reaction      Review of Systems  Constitutional: Negative.   Eyes:       She is followed by Dr. Venetia Maxon for her eye exams. She reports that he suggested that she have her thyroid checked. He feels her left eye is "protruding".   Respiratory: Negative.   Cardiovascular: Negative.   Gastrointestinal: Negative.   Neurological: Negative.   Psychiatric/Behavioral: Negative.      Today's Vitals   12/06/19 0853  BP: 132/78  Pulse: 75  Temp: 98.2 F (36.8 C)  TempSrc: Oral  Weight: 175 lb 3.2  oz (79.5 kg)  Height: 5' 2.8" (1.595 m)   Body mass index is 31.23 kg/m.   Objective:  Physical Exam Vitals and nursing note reviewed.  Constitutional:      Appearance: Normal appearance.  HENT:     Head: Normocephalic and atraumatic.  Eyes:     Extraocular Movements: Extraocular movements intact.     Conjunctiva/sclera: Conjunctivae normal.     Comments: I am unable to see sclera above pupil of her left eye.   Cardiovascular:     Rate and Rhythm: Normal rate and regular rhythm.     Heart sounds: Normal heart sounds.  Pulmonary:     Effort: Pulmonary effort is normal.     Breath sounds: Normal breath sounds.  Skin:    General: Skin is warm.  Neurological:     General: No focal deficit present.     Mental Status: She is alert.  Psychiatric:        Mood and  Affect: Mood normal.        Behavior: Behavior normal.         Assessment And Plan:     1. Pure hypercholesterolemia  Chronic. I will check lipid panel today. I suggest that she resume use of berberine, but will defer treatment suggestions until her labs are available for review. She may also benefit from red yeast rice.   - Lipid panel - TSH  2. Vitamin D deficiency  I will check vitamin D level today. I will make further recommendations once her labs are available for review.   3. Proptosis  As per her ophthalmologist, I will check TSH today.   - TSH  4. Mild intermittent asthmatic bronchitis, without complications  Chronic, yet stable. She will continue with current meds.   5. Immunization due  She was given Tdap to update her immunization history.   - Tdap (BOOSTRIX) injection 0.5 mL  6. Class 1 obesity due to excess calories with serious comorbidity and body mass index (BMI) of 31.0 to 31.9 in adult  She is encouraged to strive for BMI less than 29 to decrease cardiac risk. Encouraged to continue with her regular exercise regimen. She is advised to aim for at least 150 minutes of moderate exercise per week.   [name], MD    THE PATIENT IS ENCOURAGED TO PRACTICE SOCIAL DISTANCING DUE TO THE COVID-19 PANDEMIC.

## 2019-12-06 NOTE — Patient Instructions (Signed)

## 2019-12-07 LAB — LIPID PANEL
Chol/HDL Ratio: 3 ratio (ref 0.0–4.4)
Cholesterol, Total: 225 mg/dL — ABNORMAL HIGH (ref 100–199)
HDL: 74 mg/dL (ref >39)
LDL Chol Calc (NIH): 133 mg/dL — ABNORMAL HIGH (ref 0–99)
Triglycerides: 105 mg/dL (ref 0–149)
VLDL Cholesterol Cal: 18 mg/dL (ref 5–40)

## 2019-12-07 LAB — TSH: TSH: 2.82 u[IU]/mL (ref 0.450–4.500)

## 2019-12-12 LAB — SPECIMEN STATUS REPORT

## 2019-12-12 LAB — VITAMIN D 25 HYDROXY (VIT D DEFICIENCY, FRACTURES): Vit D, 25-Hydroxy: 28 ng/mL — ABNORMAL LOW (ref 30.0–100.0)

## 2019-12-14 ENCOUNTER — Other Ambulatory Visit: Payer: Self-pay

## 2019-12-14 MED ORDER — VITAMIN D (ERGOCALCIFEROL) 1.25 MG (50000 UNIT) PO CAPS: ORAL_CAPSULE | ORAL | 0 refills | Status: DC

## 2020-03-01 ENCOUNTER — Other Ambulatory Visit: Payer: Self-pay | Admitting: Internal Medicine

## 2020-04-19 LAB — HM MAMMOGRAPHY: HM Mammogram: NORMAL (ref 0–4)

## 2020-04-23 LAB — HM MAMMOGRAPHY: HM Mammogram: NORMAL (ref 0–4)

## 2020-04-24 ENCOUNTER — Other Ambulatory Visit: Payer: Self-pay | Admitting: Internal Medicine

## 2020-04-24 DIAGNOSIS — Z0000 Encounter for general adult medical examination without abnormal findings: Secondary | ICD-10-CM

## 2020-04-25 ENCOUNTER — Other Ambulatory Visit: Payer: Self-pay

## 2020-04-25 ENCOUNTER — Other Ambulatory Visit: Payer: 59

## 2020-04-26 LAB — CBC
Hematocrit: 41.7 % (ref 34.0–46.6)
Hemoglobin: 13.9 g/dL (ref 11.1–15.9)
MCH: 28.3 pg (ref 26.6–33.0)
MCHC: 33.3 g/dL (ref 31.5–35.7)
MCV: 85 fL (ref 79–97)
Platelets: 270 10*3/uL (ref 150–450)
RBC: 4.92 x10E6/uL (ref 3.77–5.28)
RDW: 13 % (ref 11.7–15.4)
WBC: 3.9 10*3/uL (ref 3.4–10.8)

## 2020-04-26 LAB — CMP14+EGFR
ALT: 41 IU/L — ABNORMAL HIGH (ref 0–32)
AST: 29 IU/L (ref 0–40)
Albumin/Globulin Ratio: 1.8 (ref 1.2–2.2)
Albumin: 4.8 g/dL (ref 3.8–4.8)
Alkaline Phosphatase: 87 IU/L (ref 44–121)
BUN/Creatinine Ratio: 14 (ref 12–28)
BUN: 10 mg/dL (ref 8–27)
Bilirubin Total: 0.4 mg/dL (ref 0.0–1.2)
CO2: 25 mmol/L (ref 20–29)
Calcium: 9.6 mg/dL (ref 8.7–10.3)
Chloride: 103 mmol/L (ref 96–106)
Creatinine, Ser: 0.72 mg/dL (ref 0.57–1.00)
GFR calc Af Amer: 104 mL/min/{1.73_m2} (ref >59)
GFR calc non Af Amer: 90 mL/min/{1.73_m2} (ref >59)
Globulin, Total: 2.6 g/dL (ref 1.5–4.5)
Glucose: 82 mg/dL (ref 65–99)
Potassium: 4.2 mmol/L (ref 3.5–5.2)
Sodium: 141 mmol/L (ref 134–144)
Total Protein: 7.4 g/dL (ref 6.0–8.5)

## 2020-04-26 LAB — HEMOGLOBIN A1C
Est. average glucose Bld gHb Est-mCnc: 111 mg/dL
Hgb A1c MFr Bld: 5.5 % (ref 4.8–5.6)

## 2020-05-01 ENCOUNTER — Other Ambulatory Visit: Payer: Self-pay

## 2020-05-01 ENCOUNTER — Encounter: Payer: Self-pay | Admitting: Internal Medicine

## 2020-05-01 ENCOUNTER — Ambulatory Visit: Payer: 59 | Admitting: Internal Medicine

## 2020-05-01 VITALS — BP 124/78 | HR 67 | Temp 98.1°F | Ht 62.0 in | Wt 181.2 lb

## 2020-05-01 DIAGNOSIS — E6609 Other obesity due to excess calories: Secondary | ICD-10-CM | POA: Diagnosis not present

## 2020-05-01 DIAGNOSIS — Z0000 Encounter for general adult medical examination without abnormal findings: Secondary | ICD-10-CM | POA: Diagnosis not present

## 2020-05-01 DIAGNOSIS — Z23 Encounter for immunization: Secondary | ICD-10-CM

## 2020-05-01 DIAGNOSIS — Z6833 Body mass index (BMI) 33.0-33.9, adult: Secondary | ICD-10-CM

## 2020-05-01 DIAGNOSIS — M542 Cervicalgia: Secondary | ICD-10-CM

## 2020-05-01 MED ORDER — CYCLOBENZAPRINE HCL 5 MG PO TABS: ORAL_TABLET | ORAL | 0 refills | Status: DC

## 2020-05-01 NOTE — Progress Notes (Signed)
I,Katawbba Wiggins,acting as a Neurosurgeon for [name], MD.,have documented all relevant documentation on the behalf of [name], MD,as directed by  [name], MD while in the presence of [name], MD. This visit occurred during the SARS-CoV-2 public health emergency.  Safety protocols were in place, including screening questions prior to the visit, additional usage of staff PPE, and extensive cleaning of exam room while observing appropriate contact time as indicated for disinfecting solutions.  Subjective:     Patient ID: Maria Kline , female    DOB: 12-Jun-1956 , 64 y.o.   MRN: [id number]   Chief Complaint  Patient presents with  . Annual Exam    HPI  She is here today for a full physical examination. She is followed by Dr. Su Hilt for her GYN evaluations. She was last seen a week ago. She reports she has been walking 3-5 times per week.     Past Medical History:  Diagnosis Date  . Anemia    23yrs ago  . Asthma   . Cataracts, bilateral   . Chicken pox   . Complication of anesthesia 2000   c-section; excessive shivering  . Eczema    hx of  . Hx of seasonal allergies   . Hyperlipidemia   . Measles   . Mumps   . Vitamin D deficiency    Vit D on tues and fri     Family History  Problem Relation Age of Onset  . Hypotension Mother   . Hypertension Mother   . Diabetes Mother   . Dementia Mother   . GI problems Mother   . Diabetes Other   . Stroke Other   . Hypertension Other   . Heart disease Other   . Colon cancer Father   . Cancer - Colon Father   . Anesthesia problems Neg Hx   . Malignant hyperthermia Neg Hx   . Pseudochol deficiency Neg Hx      Current Outpatient Medications:  .  albuterol (VENTOLIN HFA) 108 (90 Base) MCG/ACT inhaler, Inhale 2 puffs into the lungs every 6 (six) hours as needed for wheezing or shortness of breath., Disp: 18 g, Rfl: 5 .  Barberry-Oreg Grape-Goldenseal (BERBERINE COMPLEX PO), Take 1 capsule by  mouth., Disp: , Rfl:  .  bimatoprost (LUMIGAN) 0.03 % ophthalmic solution, 1 drop at bedtime., Disp: , Rfl:  .  Cholecalciferol (VITAMIN D3) 125 MCG (5000 UT) CAPS, Take by mouth., Disp: , Rfl:  .  cycloSPORINE (RESTASIS) 0.05 % ophthalmic emulsion, Restasis 0.05 % eye drops in a dropperette  INSTILL 1 DROP IN AFFECTED EYE(S) EVERY 12 HOURS, Disp: , Rfl:  .  mometasone-formoterol (DULERA) 100-5 MCG/ACT AERO, Inhale 2 puffs into the lungs 2 (two) times daily., Disp: , Rfl:  .  Polyvinyl Alcohol-Povidone (REFRESH OP), Apply to eye., Disp: , Rfl:  .  cyclobenzaprine (FLEXERIL) 5 MG tablet, One tab po qpm prn, Disp: 30 tablet, Rfl: 0   Allergies  Allergen Reactions  . Demerol [Meperidine] Itching  . Hydrocodone Other (See Comments)    Patient states that she is not allergic to this medication  . Penicillins Other (See Comments)    Childhood reaction       The patient states she uses post menopausal status for birth control. Last LMP was Patient's last menstrual period was 09/20/2010.. Negative for Dysmenorrhea. Negative for: breast discharge, breast lump(s), breast pain and breast self exam. Associated symptoms include abnormal vaginal bleeding. Pertinent negatives include  abnormal bleeding (hematology), anxiety, decreased libido, depression, difficulty falling sleep, dyspareunia, history of infertility, nocturia, sexual dysfunction, sleep disturbances, urinary incontinence, urinary urgency, vaginal discharge and vaginal itching. Diet regular.The patient states her exercise level is  moderate.   . The patient's tobacco use is:  Social History   Tobacco Use  Smoking Status Never Smoker  Smokeless Tobacco Never Used  . She has been exposed to passive smoke. The patient's alcohol use is:  Social History   Substance and Sexual Activity  Alcohol Use Yes   Comment: socially    Review of Systems  Constitutional: Negative.   HENT: Negative.   Eyes: Negative.   Respiratory: Negative.    Cardiovascular: Negative.   Gastrointestinal: Negative.   Endocrine: Negative.   Genitourinary: Negative.   Musculoskeletal: Positive for neck pain.  Skin: Negative.   Allergic/Immunologic: Negative.   Neurological: Negative.   Hematological: Negative.   Psychiatric/Behavioral: Negative.      Today's Vitals   05/01/20 0918  BP: 124/78  Pulse: 67  Temp: 98.1 F (36.7 C)  Weight: 181 lb 3.2 oz (82.2 kg)  Height: 5\' 2"  (1.575 m)   Body mass index is 33.14 kg/m.  Wt Readings from Last 3 Encounters:  05/01/20 181 lb 3.2 oz (82.2 kg)  12/06/19 175 lb 3.2 oz (79.5 kg)  04/18/19 169 lb 3.2 oz (76.7 kg)   Objective:  Physical Exam Constitutional:      General: She is not in acute distress.    Appearance: Normal appearance. She is well-developed.  HENT:     Head: Normocephalic and atraumatic.     Right Ear: Hearing, tympanic membrane, ear canal and external ear normal. There is no impacted cerumen.     Left Ear: Hearing, tympanic membrane, ear canal and external ear normal. There is no impacted cerumen.     Nose:     Comments: Deferred, masked    Mouth/Throat:     Comments: Deferred, masked Eyes:     General: Lids are normal.     Extraocular Movements: Extraocular movements intact.     Conjunctiva/sclera: Conjunctivae normal.     Pupils: Pupils are equal, round, and reactive to light.     Funduscopic exam:    Right eye: No papilledema.        Left eye: No papilledema.  Neck:     Thyroid: No thyroid mass.     Vascular: No carotid bruit.  Cardiovascular:     Rate and Rhythm: Normal rate and regular rhythm.     Pulses: Normal pulses.     Heart sounds: Normal heart sounds. No murmur heard.   Pulmonary:     Effort: Pulmonary effort is normal.     Breath sounds: Normal breath sounds.  Chest:     Breasts: Tanner Score is 5.        Right: Normal.        Left: Normal.  Abdominal:     General: Bowel sounds are normal. There is no distension.     Palpations: Abdomen is  soft.     Tenderness: There is no abdominal tenderness.  Musculoskeletal:        General: No swelling. Normal range of motion.     Cervical back: Full passive range of motion without pain, normal range of motion and neck supple. Muscular tenderness present.     Right lower leg: No edema.     Left lower leg: No edema.  Skin:    General: Skin is warm and dry.  Capillary Refill: Capillary refill takes less than 2 seconds.  Neurological:     General: No focal deficit present.     Mental Status: She is alert and oriented to person, place, and time.     Cranial Nerves: No cranial nerve deficit.     Sensory: No sensory deficit.  Psychiatric:        Mood and Affect: Mood normal.        Behavior: Behavior normal.        Thought Content: Thought content normal.        Judgment: Judgment normal.         Assessment And Plan:     1. Encounter for annual physical exam Comments: A full exam was performed. Importance of monthly self breast exams was discussed with the patient. She had her labs drawn prior to visit, we went over these in detail during her visit. PATIENT IS ADVISED TO GET 30-45 MINUTES REGULAR EXERCISE NO LESS THAN FOUR TO FIVE DAYS PER WEEK - BOTH WEIGHTBEARING EXERCISES AND AEROBIC ARE RECOMMENDED.  PATIENT IS ADVISED TO FOLLOW A HEALTHY DIET WITH AT LEAST SIX FRUITS/VEGGIES PER DAY, DECREASE INTAKE OF RED MEAT, AND TO INCREASE FISH INTAKE TO TWO DAYS PER WEEK.  MEATS/FISH SHOULD NOT BE FRIED, BAKED OR BROILED IS PREFERABLE.  I SUGGEST WEARING SPF 50 SUNSCREEN ON EXPOSED PARTS AND ESPECIALLY WHEN IN THE DIRECT SUNLIGHT FOR AN EXTENDED PERIOD OF TIME.  PLEASE AVOID FAST FOOD RESTAURANTS AND INCREASE YOUR WATER INTAKE.  2. Cervicalgia Comments: She was given rx cyclobenzaprine 5mg  1-2 tabs po qpm prn. She plans to f/u with Ortho should her sx persist. Advised to supplement w/ Mg as well.   3. Class 1 obesity due to excess calories with serious comorbidity and body mass index (BMI)  of 33.0 to 33.9 in adult Comments: She was congratulated on her exercise regimen, encouraged to keep up the great work. Advised to aim for BMI less than 30 to decrease cardiac risk.   4. Immunization due Comments: She was given Pneumovax-23 to update her immunization history.  - Pneumococcal polysaccharide vaccine 23-valent greater than or equal to 2yo subcutaneous/IM She is encouraged to strive for BMI less than 30 to decrease cardiac risk. Advised to aim for at least 150 minutes of exercise per week.    Patient was given opportunity to ask questions. Patient verbalized understanding of the plan and was able to repeat key elements of the plan. All questions were answered to their satisfaction.   , MD   I, [name], MD, have reviewed all documentation for this visit. The documentation on 05/05/20 for the exam, diagnosis, procedures, and orders are all accurate and complete.  THE PATIENT IS ENCOURAGED TO PRACTICE SOCIAL DISTANCING DUE TO THE COVID-19 PANDEMIC.

## 2020-05-01 NOTE — Patient Instructions (Addendum)
Dr. Lorelle Gibbs, [facility] (chiropractor with laser therapy)   Health Maintenance, Female Adopting a healthy lifestyle and getting preventive care are important in promoting health and wellness. Ask your health care provider about:  The right schedule for you to have regular tests and exams.  Things you can do on your own to prevent diseases and keep yourself healthy. What should I know about diet, weight, and exercise? Eat a healthy diet   Eat a diet that includes plenty of vegetables, fruits, low-fat dairy products, and lean protein.  Do not eat a lot of foods that are high in solid fats, added sugars, or sodium. Maintain a healthy weight Body mass index (BMI) is used to identify weight problems. It estimates body fat based on height and weight. Your health care provider can help determine your BMI and help you achieve or maintain a healthy weight. Get regular exercise Get regular exercise. This is one of the most important things you can do for your health. Most adults should:  Exercise for at least 150 minutes each week. The exercise should increase your heart rate and make you sweat (moderate-intensity exercise).  Do strengthening exercises at least twice a week. This is in addition to the moderate-intensity exercise.  Spend less time sitting. Even light physical activity can be beneficial. Watch cholesterol and blood lipids Have your blood tested for lipids and cholesterol at 62 years of age, then have this test every 5 years. Have your cholesterol levels checked more often if:  Your lipid or cholesterol levels are high.  You are older than 62 years of age.  You are at high risk for heart disease. What should I know about cancer screening? Depending on your health history and family history, you may need to have cancer screening at various ages. This may include screening for:  Breast cancer.  Cervical cancer.  Colorectal cancer.  Skin cancer.  Lung  cancer. What should I know about heart disease, diabetes, and high blood pressure? Blood pressure and heart disease  High blood pressure causes heart disease and increases the risk of stroke. This is more likely to develop in people who have high blood pressure readings, are of African descent, or are overweight.  Have your blood pressure checked: ? Every 3-5 years if you are 5-22 years of age. ? Every year if you are 18 years old or older. Diabetes Have regular diabetes screenings. This checks your fasting blood sugar level. Have the screening done:  Once every three years after age 24 if you are at a normal weight and have a low risk for diabetes.  More often and at a younger age if you are overweight or have a high risk for diabetes. What should I know about preventing infection? Hepatitis B If you have a higher risk for hepatitis B, you should be screened for this virus. Talk with your health care provider to find out if you are at risk for hepatitis B infection. Hepatitis C Testing is recommended for:  Everyone born from [birth year] through 1963.  Anyone with known risk factors for hepatitis C. Sexually transmitted infections (STIs)  Get screened for STIs, including gonorrhea and chlamydia, if: ? You are sexually active and are younger than 62 years of age. ? You are older than 62 years of age and your health care provider tells you that you are at risk for this type of infection. ? Your sexual activity has changed since you were last screened, and you are at increased  risk for chlamydia or gonorrhea. Ask your health care provider if you are at risk.  Ask your health care provider about whether you are at high risk for HIV. Your health care provider may recommend a prescription medicine to help prevent HIV infection. If you choose to take medicine to prevent HIV, you should first get tested for HIV. You should then be tested every 3 months for as long as you are taking the  medicine. Pregnancy  If you are about to stop having your period (premenopausal) and you may become pregnant, seek counseling before you get pregnant.  Take 400 to 800 micrograms (mcg) of folic acid every day if you become pregnant.  Ask for birth control (contraception) if you want to prevent pregnancy. Osteoporosis and menopause Osteoporosis is a disease in which the bones lose minerals and strength with aging. This can result in bone fractures. If you are 40 years old or older, or if you are at risk for osteoporosis and fractures, ask your health care provider if you should:  Be screened for bone loss.  Take a calcium or vitamin D supplement to lower your risk of fractures.  Be given hormone replacement therapy (HRT) to treat symptoms of menopause. Follow these instructions at home: Lifestyle  Do not use any products that contain nicotine or tobacco, such as cigarettes, e-cigarettes, and chewing tobacco. If you need help quitting, ask your health care provider.  Do not use street drugs.  Do not share needles.  Ask your health care provider for help if you need support or information about quitting drugs. Alcohol use  Do not drink alcohol if: ? Your health care provider tells you not to drink. ? You are pregnant, may be pregnant, or are planning to become pregnant.  If you drink alcohol: ? Limit how much you use to 0-1 drink a day. ? Limit intake if you are breastfeeding.  Be aware of how much alcohol is in your drink. In the [place] one drink equals one 12 oz bottle of beer (355 mL), one 5 oz glass of wine (148 mL), or one 1 oz glass of hard liquor (44 mL). General instructions  Schedule regular health, dental, and eye exams.  Stay current with your vaccines.  Tell your health care provider if: ? You often feel depressed. ? You have ever been abused or do not feel safe at home. Summary  Adopting a healthy lifestyle and getting preventive care are important in  promoting health and wellness.  Follow your health care provider's instructions about healthy diet, exercising, and getting tested or screened for diseases.  Follow your health care provider's instructions on monitoring your cholesterol and blood pressure. This information is not intended to replace advice given to you by your health care provider. Make sure you discuss any questions you have with your health care provider. Document Revised: 06/08/2018 Document Reviewed: 06/08/2018 Elsevier Patient Education  2020 [employer].

## 2020-05-03 ENCOUNTER — Encounter: Payer: Self-pay | Admitting: Internal Medicine

## 2020-05-09 LAB — HM DEXA SCAN

## 2020-05-17 ENCOUNTER — Encounter: Payer: Self-pay | Admitting: Internal Medicine

## 2020-07-24 ENCOUNTER — Other Ambulatory Visit: Payer: Self-pay

## 2020-07-24 ENCOUNTER — Other Ambulatory Visit: Payer: 59

## 2020-07-24 DIAGNOSIS — E785 Hyperlipidemia, unspecified: Secondary | ICD-10-CM

## 2020-07-24 DIAGNOSIS — E559 Vitamin D deficiency, unspecified: Secondary | ICD-10-CM

## 2020-07-25 LAB — LIPID PANEL
Chol/HDL Ratio: 3.7 ratio (ref 0.0–4.4)
Cholesterol, Total: 269 mg/dL — ABNORMAL HIGH (ref 100–199)
HDL: 73 mg/dL (ref >39)
LDL Chol Calc (NIH): 176 mg/dL — ABNORMAL HIGH (ref 0–99)
Triglycerides: 116 mg/dL (ref 0–149)
VLDL Cholesterol Cal: 20 mg/dL (ref 5–40)

## 2020-07-25 LAB — VITAMIN D 25 HYDROXY (VIT D DEFICIENCY, FRACTURES): Vit D, 25-Hydroxy: 40.8 ng/mL (ref 30.0–100.0)

## 2020-08-09 ENCOUNTER — Other Ambulatory Visit: Payer: Self-pay

## 2020-08-09 MED ORDER — ATORVASTATIN CALCIUM 40 MG PO TABS: 40.0000 mg | ORAL_TABLET | Freq: Every day | ORAL | 1 refills | Status: DC

## 2020-08-31 ENCOUNTER — Other Ambulatory Visit: Payer: Self-pay | Admitting: Internal Medicine

## 2020-09-23 ENCOUNTER — Other Ambulatory Visit: Payer: Self-pay | Admitting: Internal Medicine

## 2020-09-30 ENCOUNTER — Ambulatory Visit: Payer: 59 | Admitting: Internal Medicine

## 2020-09-30 ENCOUNTER — Encounter: Payer: Self-pay | Admitting: Internal Medicine

## 2020-09-30 ENCOUNTER — Other Ambulatory Visit: Payer: Self-pay

## 2020-09-30 VITALS — BP 122/78 | HR 101 | Temp 98.4°F | Ht 62.0 in | Wt 184.4 lb

## 2020-09-30 DIAGNOSIS — M816 Localized osteoporosis [Lequesne]: Secondary | ICD-10-CM

## 2020-09-30 DIAGNOSIS — J301 Allergic rhinitis due to pollen: Secondary | ICD-10-CM

## 2020-09-30 DIAGNOSIS — E7800 Pure hypercholesterolemia, unspecified: Secondary | ICD-10-CM | POA: Diagnosis not present

## 2020-09-30 DIAGNOSIS — E6609 Other obesity due to excess calories: Secondary | ICD-10-CM

## 2020-09-30 DIAGNOSIS — R000 Tachycardia, unspecified: Secondary | ICD-10-CM | POA: Diagnosis not present

## 2020-09-30 DIAGNOSIS — J4520 Mild intermittent asthma, uncomplicated: Secondary | ICD-10-CM | POA: Diagnosis not present

## 2020-09-30 DIAGNOSIS — Z6833 Body mass index (BMI) 33.0-33.9, adult: Secondary | ICD-10-CM

## 2020-09-30 MED ORDER — MOMETASONE FURO-FORMOTEROL FUM 100-5 MCG/ACT IN AERO: 2.0000 | INHALATION_SPRAY | Freq: Two times a day (BID) | RESPIRATORY_TRACT | 11 refills | Status: DC

## 2020-09-30 MED ORDER — MONTELUKAST SODIUM 10 MG PO TABS: 10.0000 mg | ORAL_TABLET | Freq: Every day | ORAL | 1 refills | Status: DC

## 2020-09-30 NOTE — Patient Instructions (Signed)
High Cholesterol  High cholesterol is a condition in which the blood has high levels of a white, waxy substance similar to fat (cholesterol). The liver makes all the cholesterol that the body needs. The human body needs small amounts of cholesterol to help build cells. A person gets extra or excess cholesterol from the food that he or she eats. The blood carries cholesterol from the liver to the rest of the body. If you have high cholesterol, deposits (plaques) may build up on the walls of your arteries. Arteries are the blood vessels that carry blood away from your heart. These plaques make the arteries narrow and stiff. Cholesterol plaques increase your risk for heart attack and stroke. Work with your health care provider to keep your cholesterol levels in a healthy range. What increases the risk? The following factors may make you more likely to develop this condition:  Eating foods that are high in animal fat (saturated fat) or cholesterol.  Being overweight.  Not getting enough exercise.  A family history of high cholesterol (familial hypercholesterolemia).  Use of tobacco products.  Having diabetes. What are the signs or symptoms? There are no symptoms of this condition. How is this diagnosed? This condition may be diagnosed based on the results of a blood test.  If you are older than 63 years of age, your health care provider may check your cholesterol levels every 4-6 years.  You may be checked more often if you have high cholesterol or other risk factors for heart disease. The blood test for cholesterol measures:  "Bad" cholesterol, or LDL cholesterol. This is the main type of cholesterol that causes heart disease. The desired level is less than 100 mg/dL.  "Good" cholesterol, or HDL cholesterol. HDL helps protect against heart disease by cleaning the arteries and carrying the LDL to the liver for processing. The desired level for HDL is 60 mg/dL or higher.  Triglycerides.  These are fats that your body can store or burn for energy. The desired level is less than 150 mg/dL.  Total cholesterol. This measures the total amount of cholesterol in your blood and includes LDL, HDL, and triglycerides. The desired level is less than 200 mg/dL. How is this treated? This condition may be treated with:  Diet changes. You may be asked to eat foods that have more fiber and less saturated fats or added sugar.  Lifestyle changes. These may include regular exercise, maintaining a healthy weight, and quitting use of tobacco products.  Medicines. These are given when diet and lifestyle changes have not worked. You may be prescribed a statin medicine to help lower your cholesterol levels. Follow these instructions at home: Eating and drinking  Eat a healthy, balanced diet. This diet includes: ? Daily servings of a variety of fresh, frozen, or canned fruits and vegetables. ? Daily servings of whole grain foods that are rich in fiber. ? Foods that are low in saturated fats and trans fats. These include poultry and fish without skin, lean cuts of meat, and low-fat dairy products. ? A variety of fish, especially oily fish that contain omega-3 fatty acids. Aim to eat fish at least 2 times a week.  Avoid foods and drinks that have added sugar.  Use healthy cooking methods, such as roasting, grilling, broiling, baking, poaching, steaming, and stir-frying. Do not fry your food except for stir-frying.   Lifestyle  Get regular exercise. Aim to exercise for a total of 150 minutes a week. Increase your activity level by doing activities   such as gardening, walking, and taking the stairs.  Do not use any products that contain nicotine or tobacco, such as cigarettes, e-cigarettes, and chewing tobacco. If you need help quitting, ask your health care provider.   General instructions  Take over-the-counter and prescription medicines only as told by your health care provider.  Keep all  follow-up visits as told by your health care provider. This is important. Where to find more information  American Heart Association: www.heart.org  National Heart, Lung, and Blood Institute: www.nhlbi.nih.gov Contact a health care provider if:  You have trouble achieving or maintaining a healthy diet or weight.  You are starting an exercise program.  You are unable to stop smoking. Get help right away if:  You have chest pain.  You have trouble breathing.  You have any symptoms of a stroke. "BE FAST" is an easy way to remember the main warning signs of a stroke: ? B - Balance. Signs are dizziness, sudden trouble walking, or loss of balance. ? E - Eyes. Signs are trouble seeing or a sudden change in vision. ? F - Face. Signs are sudden weakness or numbness of the face, or the face or eyelid drooping on one side. ? A - Arms. Signs are weakness or numbness in an arm. This happens suddenly and usually on one side of the body. ? S - Speech. Signs are sudden trouble speaking, slurred speech, or trouble understanding what people say. ? T - Time. Time to call emergency services. Write down what time symptoms started.  You have other signs of a stroke, such as: ? A sudden, severe headache with no known cause. ? Nausea or vomiting. ? Seizure. These symptoms may represent a serious problem that is an emergency. Do not wait to see if the symptoms will go away. Get medical help right away. Call your local emergency services (911 in the [place] Do not drive yourself to the hospital. Summary  Cholesterol plaques increase your risk for heart attack and stroke. Work with your health care provider to keep your cholesterol levels in a healthy range.  Eat a healthy, balanced diet, get regular exercise, and maintain a healthy weight.  Do not use any products that contain nicotine or tobacco, such as cigarettes, e-cigarettes, and chewing tobacco.  Get help right away if you have any symptoms of a  stroke. This information is not intended to replace advice given to you by your health care provider. Make sure you discuss any questions you have with your health care provider. Document Revised: 05/15/2019 Document Reviewed: 05/15/2019 Elsevier Patient Education  2021 Elsevier Inc.  

## 2020-09-30 NOTE — Progress Notes (Signed)
I,Katawbba Wiggins,acting as a Neurosurgeon for [name], MD.,have documented all relevant documentation on the behalf of [name], MD,as directed by  [name], MD while in the presence of [name], MD.  This visit occurred during the SARS-CoV-2 public health emergency.  Safety protocols were in place, including screening questions prior to the visit, additional usage of staff PPE, and extensive cleaning of exam room while observing appropriate contact time as indicated for disinfecting solutions.  Subjective:     Patient ID: Maria Kline , female    DOB: 05-14-1956 , 65 y.o.   MRN: [id number]   Chief Complaint  Patient presents with  . Hyperlipidemia    HPI  She presents for chol check today. She was started on atorvastatin to address elevated lipids. She has not had any issues with the medication. She denies headaches, chest pain and shortness of breath.     Past Medical History:  Diagnosis Date  . Anemia    29yrs ago  . Asthma   . Cataracts, bilateral   . Chicken pox   . Complication of anesthesia 2000   c-section; excessive shivering  . Eczema    hx of  . Hx of seasonal allergies   . Hyperlipidemia   . Measles   . Mumps   . Vitamin D deficiency    Vit D on tues and fri     Family History  Problem Relation Age of Onset  . Hypotension Mother   . Hypertension Mother   . Diabetes Mother   . Dementia Mother   . GI problems Mother   . Diabetes Other   . Stroke Other   . Hypertension Other   . Heart disease Other   . Colon cancer Father   . Cancer - Colon Father   . Anesthesia problems Neg Hx   . Malignant hyperthermia Neg Hx   . Pseudochol deficiency Neg Hx      Current Outpatient Medications:  .  albuterol (VENTOLIN HFA) 108 (90 Base) MCG/ACT inhaler, TAKE 2 PUFFS BY MOUTH EVERY 6 HOURS AS NEEDED FOR WHEEZE OR SHORTNESS OF BREATH, Disp: 18 each, Rfl: 5 .  bimatoprost (LUMIGAN) 0.03 % ophthalmic solution, 1 drop at bedtime., Disp: ,  Rfl:  .  Cholecalciferol (VITAMIN D3) 125 MCG (5000 UT) CAPS, Take by mouth., Disp: , Rfl:  .  cycloSPORINE (RESTASIS) 0.05 % ophthalmic emulsion, Restasis 0.05 % eye drops in a dropperette  INSTILL 1 DROP IN AFFECTED EYE(S) EVERY 12 HOURS, Disp: , Rfl:  .  Polyvinyl Alcohol-Povidone (REFRESH OP), Apply to eye., Disp: , Rfl:  .  atorvastatin (LIPITOR) 40 MG tablet, TAKE 1 TABLET BY MOUTH EVERY DAY, Disp: 30 tablet, Rfl: 1 .  cyclobenzaprine (FLEXERIL) 5 MG tablet, One tab po qpm prn (Patient not taking: Reported on 09/30/2020), Disp: 30 tablet, Rfl: 0 .  mometasone-formoterol (DULERA) 100-5 MCG/ACT AERO, Inhale 2 puffs into the lungs 2 (two) times daily., Disp: 1 each, Rfl: 11 .  montelukast (SINGULAIR) 10 MG tablet, Take 1 tablet (10 mg total) by mouth daily., Disp: 90 tablet, Rfl: 1 .  RHOPRESSA 0.02 % SOLN, Apply to eye., Disp: , Rfl:    Allergies  Allergen Reactions  . Demerol [Meperidine] Itching  . Hydrocodone Other (See Comments)    Patient states that she is not allergic to this medication  . Penicillins Other (See Comments)    Childhood reaction      Review of Systems  Constitutional: Negative.  Respiratory: Negative.   Cardiovascular: Negative.   Gastrointestinal: Negative.   Neurological: Negative.   Psychiatric/Behavioral: Negative.      Today's Vitals   09/30/20 1411  BP: 122/78  Pulse: (!) 101  Temp: 98.4 F (36.9 C)  TempSrc: Oral  Weight: 184 lb 6.4 oz (83.6 kg)  Height: 5\' 2"  (1.575 m)   Body mass index is 33.73 kg/m.  Wt Readings from Last 3 Encounters:  09/30/20 184 lb 6.4 oz (83.6 kg)  05/01/20 181 lb 3.2 oz (82.2 kg)  12/06/19 175 lb 3.2 oz (79.5 kg)   Objective:  Physical Exam Vitals and nursing note reviewed.  Constitutional:      Appearance: Normal appearance.  HENT:     Head: Normocephalic and atraumatic.     Nose:     Comments: Masked     Mouth/Throat:     Comments: Masked  Cardiovascular:     Rate and Rhythm: Regular rhythm.  Tachycardia present.     Heart sounds: Normal heart sounds.  Pulmonary:     Effort: Pulmonary effort is normal.     Breath sounds: Normal breath sounds.  Musculoskeletal:     Cervical back: Normal range of motion.  Skin:    General: Skin is warm.  Neurological:     General: No focal deficit present.     Mental Status: She is alert.  Psychiatric:        Mood and Affect: Mood normal.        Behavior: Behavior normal.         Assessment And Plan:     1. Pure hypercholesterolemia Comments: I will check fasting lipid panel and ALT. I will adjust meds as needed.  - Lipid panel - ALT  2. Mild intermittent asthmatic bronchitis without complication Comments: Chronic. She was given sample of Asmanex 02/05/20. Unfortunately, her insurance does not cover Dulera or Breo.   3. Tachycardia Comments: She is encouraged to stay well hydrated. May also benefit from magnesium supplementation daily.   4. Seasonal allergic rhinitis due to pollen Comments: I will send rx montelukast 10mg  daily.   5. Localized osteoporosis without current pathological fracture Comments: I reviewed her dexa scan results. ADvised to continue with calcium/vitamin D supplementation and engage in weight-bearing exercises 3x/week.   6. Class 1 obesity due to excess calories with serious comorbidity and body mass index (BMI) of 33.0 to 33.9 in adult  She is encouraged to strive for BMI less than 30 to decrease cardiac risk. Advised to aim for at least 150 minutes of exercise per week.   Patient was given opportunity to ask questions. Patient verbalized understanding of the plan and was able to repeat key elements of the plan. All questions were answered to their satisfaction.   I, , MD, have reviewed all documentation for this visit. The documentation on 09/30/20 for the exam, diagnosis, procedures, and orders are all accurate and complete.   IF YOU HAVE BEEN REFERRED TO A SPECIALIST, IT MAY TAKE 1-2  WEEKS TO SCHEDULE/PROCESS THE REFERRAL. IF YOU HAVE NOT HEARD FROM US/SPECIALIST IN TWO WEEKS, PLEASE GIVE [name] A CALL AT [phone] X [phone].   THE PATIENT IS ENCOURAGED TO PRACTICE SOCIAL DISTANCING DUE TO THE COVID-19 PANDEMIC.

## 2020-10-01 LAB — ALT: ALT: 97 IU/L — ABNORMAL HIGH (ref 0–32)

## 2020-10-01 LAB — LIPID PANEL
Chol/HDL Ratio: 2.2 ratio (ref 0.0–4.4)
Cholesterol, Total: 150 mg/dL (ref 100–199)
HDL: 67 mg/dL (ref >39)
LDL Chol Calc (NIH): 69 mg/dL (ref 0–99)
Triglycerides: 69 mg/dL (ref 0–149)
VLDL Cholesterol Cal: 14 mg/dL (ref 5–40)

## 2020-10-05 ENCOUNTER — Other Ambulatory Visit: Payer: Self-pay | Admitting: Internal Medicine

## 2020-10-07 ENCOUNTER — Other Ambulatory Visit: Payer: Self-pay

## 2020-10-07 MED ORDER — MONTELUKAST SODIUM 10 MG PO TABS: 10.0000 mg | ORAL_TABLET | Freq: Every day | ORAL | 1 refills | Status: DC

## 2020-10-10 ENCOUNTER — Other Ambulatory Visit: Payer: Self-pay | Admitting: Internal Medicine

## 2020-10-29 ENCOUNTER — Encounter: Payer: Self-pay | Admitting: Internal Medicine

## 2020-10-29 ENCOUNTER — Ambulatory Visit: Payer: 59 | Admitting: Internal Medicine

## 2020-10-29 ENCOUNTER — Other Ambulatory Visit: Payer: Self-pay

## 2020-10-29 VITALS — BP 126/84 | HR 84 | Temp 99.0°F | Ht 62.0 in | Wt 188.0 lb

## 2020-10-29 DIAGNOSIS — E6609 Other obesity due to excess calories: Secondary | ICD-10-CM | POA: Diagnosis not present

## 2020-10-29 DIAGNOSIS — Z6833 Body mass index (BMI) 33.0-33.9, adult: Secondary | ICD-10-CM

## 2020-10-29 DIAGNOSIS — J4520 Mild intermittent asthma, uncomplicated: Secondary | ICD-10-CM

## 2020-10-29 DIAGNOSIS — R197 Diarrhea, unspecified: Secondary | ICD-10-CM | POA: Diagnosis not present

## 2020-10-29 NOTE — Progress Notes (Signed)
I,Katawbba Wiggins,acting as a Neurosurgeon for [name], MD.,have documented all relevant documentation on the behalf of [name], MD,as directed by  [name], MD while in the presence of [name], MD.  This visit occurred during the SARS-CoV-2 public health emergency.  Safety protocols were in place, including screening questions prior to the visit, additional usage of staff PPE, and extensive cleaning of exam room while observing appropriate contact time as indicated for disinfecting solutions.  Subjective:     Patient ID: Maria Kline , female    DOB: September 07, 1955 , 65 y.o.   MRN: [id number]   Chief Complaint  Patient presents with  . Asthma    HPI  The patient is here today for a follow-up on her asthma. She was given sample of Asmanex, she did not feel her best while on this medication. Feels much better on Dulera.     Past Medical History:  Diagnosis Date  . Anemia    26yrs ago  . Asthma   . Cataracts, bilateral   . Chicken pox   . Complication of anesthesia 2000   c-section; excessive shivering  . Eczema    hx of  . Hx of seasonal allergies   . Hyperlipidemia   . Measles   . Mumps   . Vitamin D deficiency    Vit D on tues and fri     Family History  Problem Relation Age of Onset  . Hypotension Mother   . Hypertension Mother   . Diabetes Mother   . Dementia Mother   . GI problems Mother   . Diabetes Other   . Stroke Other   . Hypertension Other   . Heart disease Other   . Colon cancer Father   . Cancer - Colon Father   . Anesthesia problems Neg Hx   . Malignant hyperthermia Neg Hx   . Pseudochol deficiency Neg Hx      Current Outpatient Medications:  .  albuterol (VENTOLIN HFA) 108 (90 Base) MCG/ACT inhaler, TAKE 2 PUFFS BY MOUTH EVERY 6 HOURS AS NEEDED FOR WHEEZE OR SHORTNESS OF BREATH, Disp: 18 each, Rfl: 5 .  bimatoprost (LUMIGAN) 0.03 % ophthalmic solution, 1 drop at bedtime., Disp: , Rfl:  .  Cholecalciferol (VITAMIN  D3) 125 MCG (5000 UT) CAPS, Take by mouth., Disp: , Rfl:  .  cycloSPORINE (RESTASIS) 0.05 % ophthalmic emulsion, Restasis 0.05 % eye drops in a dropperette  INSTILL 1 DROP IN AFFECTED EYE(S) EVERY 12 HOURS, Disp: , Rfl:  .  Mometasone Furoate (ASMANEX HFA) 100 MCG/ACT AERO, Inhale into the lungs., Disp: , Rfl:  .  montelukast (SINGULAIR) 10 MG tablet, Take 1 tablet (10 mg total) by mouth daily., Disp: 90 tablet, Rfl: 1 .  RHOPRESSA 0.02 % SOLN, Apply to eye., Disp: , Rfl:  .  atorvastatin (LIPITOR) 40 MG tablet, Take 1 tablet (40 mg total) by mouth daily., Disp: 90 tablet, Rfl: 2 .  cyclobenzaprine (FLEXERIL) 5 MG tablet, One tab po qpm prn (Patient not taking: No sig reported), Disp: 30 tablet, Rfl: 0 .  fluticasone-salmeterol (ADVAIR DISKUS) 250-50 MCG/ACT AEPB, Inhale 1 puff into the lungs in the morning and at bedtime., Disp: 60 each, Rfl: 2   Allergies  Allergen Reactions  . Demerol [Meperidine] Itching  . Hydrocodone Other (See Comments)    Patient states that she is not allergic to this medication  . Penicillins Other (See Comments)    Childhood reaction  Review of Systems  Constitutional: Negative.   Respiratory: Negative.   Cardiovascular: Negative.   Gastrointestinal: Positive for diarrhea.       Thinks her sx are due to atorvastatin.   Psychiatric/Behavioral: Negative.   All other systems reviewed and are negative.    Today's Vitals   10/29/20 1012  BP: 126/84  Pulse: 84  Temp: 99 F (37.2 C)  TempSrc: Oral  Weight: 188 lb (85.3 kg)  Height: 5\' 2"  (1.575 m)   Body mass index is 34.39 kg/m.  Wt Readings from Last 3 Encounters:  10/29/20 188 lb (85.3 kg)  09/30/20 184 lb 6.4 oz (83.6 kg)  05/01/20 181 lb 3.2 oz (82.2 kg)   Objective:  Physical Exam Vitals and nursing note reviewed.  Constitutional:      Appearance: Normal appearance.  HENT:     Head: Normocephalic and atraumatic.     Nose:     Comments: Masked     Mouth/Throat:     Comments: Masked   Cardiovascular:     Rate and Rhythm: Normal rate and regular rhythm.     Heart sounds: Normal heart sounds.  Pulmonary:     Effort: Pulmonary effort is normal.     Breath sounds: Normal breath sounds.  Musculoskeletal:     Cervical back: Normal range of motion.  Skin:    General: Skin is warm.  Neurological:     General: No focal deficit present.     Mental Status: She is alert.  Psychiatric:        Mood and Affect: Mood normal.        Behavior: Behavior normal.         Assessment And Plan:     1. Mild intermittent asthmatic bronchitis without complication Comments: Doesn't feel well on Asmanex 100. Feels better on Dulera. Unfortunately, her insurance does not cover this. Will send rx Advair HFA.   2. Diarrhea, unspecified type Comments: Appears to be due to atorvastatin. She will change to PM dosing. She will let me know if her sx persist.  3. Class 1 obesity due to excess calories with serious comorbidity and body mass index (BMI) of 33.0 to 33.9 in adult 4 She is encouraged to strive for BMI less than 30 to decrease cardiac risk. Advised to aim for at least 150 minutes of exercise per week.   Patient was given opportunity to ask questions. Patient verbalized understanding of the plan and was able to repeat key elements of the plan. All questions were answered to their satisfaction.   I, 13/03/21, MD, have reviewed all documentation for this visit. The documentation on 11/09/20 for the exam, diagnosis, procedures, and orders are all accurate and complete.   IF YOU HAVE BEEN REFERRED TO A SPECIALIST, IT MAY TAKE 1-2 WEEKS TO SCHEDULE/PROCESS THE REFERRAL. IF YOU HAVE NOT HEARD FROM US/SPECIALIST IN TWO WEEKS, PLEASE GIVE 11/11/20 A CALL AT [phone].   THE PATIENT IS ENCOURAGED TO PRACTICE SOCIAL DISTANCING DUE TO THE COVID-19 PANDEMIC.

## 2020-10-29 NOTE — Patient Instructions (Addendum)
asmanex 110 - Start with one inhalation daily, if not effective, may increase to two inhalations daily.    Asthma Action Plan, Adult An asthma action plan helps you understand how to manage your asthma and what to do when you have an asthma attack. The action plan is a color-coded plan that lists the symptoms that indicate whether or not your condition is under control and what actions to take.  If you have symptoms in the green zone, it means you are doing well.  If you have symptoms in the yellow zone, it means you are having problems.  If you have symptoms in the red zone, you need medical care right away. Follow the plan that you and your health care provider develop. Review your plan with your health care provider at each visit. What triggers your asthma? Knowing the things that can trigger an asthma attack or make your asthma symptoms worse is very important. Talk to your health care provider about your asthma triggers and how to avoid them. Record your known asthma triggers here: _______________ What is your personal best peak flow reading? If you use a peak flow meter, determine your personal best reading. Record it here: _______________ Red zone Symptoms in this zone mean that you should get medical help right away. You will likely feel distressed and have symptoms at rest that restrict your activity. You are in the red zone if:  You are breathing hard and quickly.  Your nose opens wide, your ribs show, and your neck muscles become visible when you breathe in.  Your lips, fingers, or toes are a bluish color.  You have trouble speaking in full sentences.  Your peak flow reading is less than __________ (less than 50% of your personal best).  Your symptoms do not improve within 15-20 minutes after you use your reliever or rescue medicine (bronchodilator). If you have any of these symptoms:  Call your local emergency services (911 in the [place] or go to the nearest emergency  room.  Use your reliever or rescue medicine. ? Start a nebulizer treatment or take 2-4 puffs from a metered-dose inhaler with a spacer. ? Repeat this action every 15-20 minutes until help arrives.   Yellow zone Symptoms in this zone mean that your condition may be getting worse. You may have symptoms that interfere with exercise, are noticeably worse after exposure to triggers, or are worse at the first sign of a cold (upper respiratory infection). These may include:  Waking from sleep.  Coughing, especially at night or first thing in the morning.  Mild wheezing.  Chest tightness.  A peak flow reading that is __________ to __________ (50-79% of your personal best). If you have any of these symptoms:  Add the following medicine to the ones that you use daily: ? Reliever or rescue medicine and dosage: _______________ ? Additional medicine and dosage: _______________ Call your health care provider if:  You remain in the yellow zone for __________ hours.  You are using a reliever or rescue medicine more than 2-3 times a week.   Green zone This zone means that your asthma is under control. You may not have any symptoms while you are in the green zone. This means that you:  Have no coughing or wheezing, even while you are working or playing.  Sleep through the night.  Are breathing well.  Have a peak flow reading that is above __________ (80% of your personal best or greater). If you are in the green  zone, continue to manage your asthma as directed:  Take these medicines every day: ? Controller medicine and dosage: _______________ ? Controller medicine and dosage: _______________ ? Controller medicine and dosage: _______________ ? Controller medicine and dosage: _______________  Before exercise, use this reliever or rescue medicine: _______________ Call your health care provider if you are using a reliever or rescue medicine more than 2-3 times a week.   Where to find more  information You can find more information about asthma from:  Centers for Disease Control and Prevention: SendThoughts.com.pt  American Lung Association: www.lung.org This information is not intended to replace advice given to you by your health care provider. Make sure you discuss any questions you have with your health care provider. Document Revised: 10/10/2019 Document Reviewed: 10/10/2019 Elsevier Patient Education  2021 [employer].

## 2020-10-31 ENCOUNTER — Other Ambulatory Visit: Payer: Self-pay

## 2020-10-31 MED ORDER — FLUTICASONE-SALMETEROL 250-50 MCG/ACT IN AEPB: 1.0000 | INHALATION_SPRAY | Freq: Two times a day (BID) | RESPIRATORY_TRACT | 2 refills | Status: DC

## 2020-11-05 ENCOUNTER — Other Ambulatory Visit: Payer: Self-pay | Admitting: Internal Medicine

## 2020-11-07 ENCOUNTER — Other Ambulatory Visit: Payer: Self-pay

## 2020-11-07 MED ORDER — ATORVASTATIN CALCIUM 40 MG PO TABS: 1.0000 | ORAL_TABLET | Freq: Every day | ORAL | 2 refills | Status: DC

## 2020-11-08 ENCOUNTER — Other Ambulatory Visit: Payer: Self-pay

## 2020-11-08 MED ORDER — ATORVASTATIN CALCIUM 40 MG PO TABS: 1.0000 | ORAL_TABLET | Freq: Every day | ORAL | 2 refills | Status: DC

## 2020-11-18 ENCOUNTER — Telehealth: Payer: Self-pay

## 2020-11-18 NOTE — Telephone Encounter (Signed)
-----   Message from Dorothyann Peng, MD sent at 11/16/2020  7:38 PM EDT ----- Pls call pt - pls confirm inhaler she is currently using. ?Asmanex. How is she feeling on it?does she still prefer Dulera?  Also, please call [employer] rep - he used to have Dulera. Does he have a contact we can call for samples?

## 2020-11-18 NOTE — Telephone Encounter (Signed)
I left the pt a message to call the office back. 

## 2020-11-19 ENCOUNTER — Telehealth: Payer: Self-pay

## 2020-11-19 NOTE — Telephone Encounter (Signed)
Left the pt a message to call back. Pls call pt - pls confirm inhaler she is currently using. ?Asmanex. How is she feeling on it?does she still prefer Dulera?

## 2020-11-19 NOTE — Telephone Encounter (Signed)
Duplicate

## 2020-11-19 NOTE — Telephone Encounter (Signed)
Pt did state she did not like the Advair, it gave her a grainy feeling on her tongue. She is currently using Asmanex which she feels a lot more comfortable with.

## 2021-05-07 ENCOUNTER — Other Ambulatory Visit: Payer: Self-pay

## 2021-05-07 ENCOUNTER — Other Ambulatory Visit: Payer: 59

## 2021-05-07 DIAGNOSIS — Z79899 Other long term (current) drug therapy: Secondary | ICD-10-CM

## 2021-05-07 DIAGNOSIS — Z0000 Encounter for general adult medical examination without abnormal findings: Secondary | ICD-10-CM

## 2021-05-07 DIAGNOSIS — E7800 Pure hypercholesterolemia, unspecified: Secondary | ICD-10-CM

## 2021-05-08 LAB — CMP14+EGFR
ALT: 75 IU/L — ABNORMAL HIGH (ref 0–32)
AST: 41 IU/L — ABNORMAL HIGH (ref 0–40)
Albumin/Globulin Ratio: 1.7 (ref 1.2–2.2)
Albumin: 4.4 g/dL (ref 3.8–4.8)
Alkaline Phosphatase: 110 IU/L (ref 44–121)
BUN/Creatinine Ratio: 10 — ABNORMAL LOW (ref 12–28)
BUN: 7 mg/dL — ABNORMAL LOW (ref 8–27)
Bilirubin Total: 0.4 mg/dL (ref 0.0–1.2)
CO2: 25 mmol/L (ref 20–29)
Calcium: 9.3 mg/dL (ref 8.7–10.3)
Chloride: 104 mmol/L (ref 96–106)
Creatinine, Ser: 0.73 mg/dL (ref 0.57–1.00)
Globulin, Total: 2.6 g/dL (ref 1.5–4.5)
Glucose: 84 mg/dL (ref 70–99)
Potassium: 4.3 mmol/L (ref 3.5–5.2)
Sodium: 142 mmol/L (ref 134–144)
Total Protein: 7 g/dL (ref 6.0–8.5)
eGFR: 92 mL/min/{1.73_m2} (ref >59)

## 2021-05-08 LAB — HEMOGLOBIN A1C
Est. average glucose Bld gHb Est-mCnc: 120 mg/dL
Hgb A1c MFr Bld: 5.8 % — ABNORMAL HIGH (ref 4.8–5.6)

## 2021-05-08 LAB — CBC
Hematocrit: 37.8 % (ref 34.0–46.6)
Hemoglobin: 12.5 g/dL (ref 11.1–15.9)
MCH: 28.9 pg (ref 26.6–33.0)
MCHC: 33.1 g/dL (ref 31.5–35.7)
MCV: 87 fL (ref 79–97)
Platelets: 227 10*3/uL (ref 150–450)
RBC: 4.33 x10E6/uL (ref 3.77–5.28)
RDW: 13.1 % (ref 11.7–15.4)
WBC: 3.2 10*3/uL — ABNORMAL LOW (ref 3.4–10.8)

## 2021-05-08 LAB — LIPID PANEL
Chol/HDL Ratio: 2.5 ratio (ref 0.0–4.4)
Cholesterol, Total: 159 mg/dL (ref 100–199)
HDL: 64 mg/dL (ref >39)
LDL Chol Calc (NIH): 75 mg/dL (ref 0–99)
Triglycerides: 114 mg/dL (ref 0–149)
VLDL Cholesterol Cal: 20 mg/dL (ref 5–40)

## 2021-05-13 ENCOUNTER — Other Ambulatory Visit: Payer: Self-pay

## 2021-05-13 ENCOUNTER — Encounter: Payer: Self-pay | Admitting: Internal Medicine

## 2021-05-13 ENCOUNTER — Ambulatory Visit (INDEPENDENT_AMBULATORY_CARE_PROVIDER_SITE_OTHER): Payer: 59 | Admitting: Internal Medicine

## 2021-05-13 VITALS — BP 136/88 | HR 84 | Temp 98.6°F | Ht 62.2 in | Wt 185.2 lb

## 2021-05-13 DIAGNOSIS — Z6833 Body mass index (BMI) 33.0-33.9, adult: Secondary | ICD-10-CM | POA: Diagnosis not present

## 2021-05-13 DIAGNOSIS — Z0000 Encounter for general adult medical examination without abnormal findings: Secondary | ICD-10-CM | POA: Diagnosis not present

## 2021-05-13 DIAGNOSIS — E7800 Pure hypercholesterolemia, unspecified: Secondary | ICD-10-CM | POA: Insufficient documentation

## 2021-05-13 DIAGNOSIS — Z23 Encounter for immunization: Secondary | ICD-10-CM

## 2021-05-13 DIAGNOSIS — E66811 Obesity, class 1: Secondary | ICD-10-CM | POA: Insufficient documentation

## 2021-05-13 DIAGNOSIS — E6609 Other obesity due to excess calories: Secondary | ICD-10-CM | POA: Diagnosis not present

## 2021-05-13 DIAGNOSIS — R030 Elevated blood-pressure reading, without diagnosis of hypertension: Secondary | ICD-10-CM | POA: Diagnosis not present

## 2021-05-13 DIAGNOSIS — R748 Abnormal levels of other serum enzymes: Secondary | ICD-10-CM

## 2021-05-13 MED ORDER — SHINGRIX 50 MCG/0.5ML IM SUSR: 0.5000 mL | Freq: Once | INTRAMUSCULAR | 0 refills | Status: AC

## 2021-05-13 NOTE — Patient Instructions (Signed)

## 2021-05-13 NOTE — Progress Notes (Signed)
[name],acting as a Neurosurgeon for [name], MD.,have documented all relevant documentation on the behalf of [name], MD,as directed by  [name], MD while in the presence of [name], MD.  This visit occurred during the SARS-CoV-2 public health emergency.  Safety protocols were in place, including screening questions prior to the visit, additional usage of staff PPE, and extensive cleaning of exam room while observing appropriate contact time as indicated for disinfecting solutions.  Subjective:     Patient ID: Maria Kline , female    DOB: [birth year] , 65 y.o.   MRN: [id number]   Chief Complaint  Patient presents with   Annual Exam    HPI  She is here today for a full physical examination. She is followed by Dr. Su Hilt for her GYN evaluations. She has her mammogram scheduled for tomorrow, along with her GYN visit.  She is happy to report she had a great trip to [place], [place] in October to celebrate a friends 75th birthday.     Past Medical History:  Diagnosis Date   Anemia    51yrs ago   Asthma    Cataracts, bilateral    Chicken pox    Complication of anesthesia 2000   c-section; excessive shivering   Eczema    hx of   Hx of seasonal allergies    Hyperlipidemia    Measles    Mumps    Vitamin D deficiency    Vit D on tues and fri     Family History  Problem Relation Age of Onset   Hypotension Mother    Hypertension Mother    Diabetes Mother    Dementia Mother    GI problems Mother    Diabetes Other    Stroke Other    Hypertension Other    Heart disease Other    Colon cancer Father    Cancer - Colon Father    Anesthesia problems Neg Hx    Malignant hyperthermia Neg Hx    Pseudochol deficiency Neg Hx      Current Outpatient Medications:    albuterol (VENTOLIN HFA) 108 (90 Base) MCG/ACT inhaler, TAKE 2 PUFFS BY MOUTH EVERY 6 HOURS AS NEEDED FOR WHEEZE OR SHORTNESS OF BREATH, Disp: 18 each, Rfl: 5   atorvastatin  (LIPITOR) 40 MG tablet, Take 1 tablet (40 mg total) by mouth daily., Disp: 90 tablet, Rfl: 2   bimatoprost (LUMIGAN) 0.03 % ophthalmic solution, 1 drop at bedtime., Disp: , Rfl:    Cholecalciferol (VITAMIN D3) 125 MCG (5000 UT) CAPS, Take by mouth., Disp: , Rfl:    cycloSPORINE (RESTASIS) 0.05 % ophthalmic emulsion, Restasis 0.05 % eye drops in a dropperette  INSTILL 1 DROP IN AFFECTED EYE(S) EVERY 12 HOURS, Disp: , Rfl:    fluticasone-salmeterol (ADVAIR DISKUS) 250-50 MCG/ACT AEPB, Inhale 1 puff into the lungs in the morning and at bedtime., Disp: 60 each, Rfl: 2   Mometasone Furoate (ASMANEX HFA) 100 MCG/ACT AERO, Inhale into the lungs., Disp: , Rfl:    montelukast (SINGULAIR) 10 MG tablet, Take 1 tablet (10 mg total) by mouth daily., Disp: 90 tablet, Rfl: 1   RHOPRESSA 0.02 % SOLN, Apply to eye., Disp: , Rfl:    Allergies  Allergen Reactions   Demerol [Meperidine] Itching   Hydrocodone Other (See Comments)    Patient states that she is not allergic to this medication   Penicillins Other (See Comments)    Childhood reaction  The patient states she uses post menopausal status for birth control. Last LMP was Patient's last menstrual period was 09/20/2010.. Negative for Dysmenorrhea. Negative for: breast discharge, breast lump(s), breast pain and breast self exam. Associated symptoms include abnormal vaginal bleeding. Pertinent negatives include abnormal bleeding (hematology), anxiety, decreased libido, depression, difficulty falling sleep, dyspareunia, history of infertility, nocturia, sexual dysfunction, sleep disturbances, urinary incontinence, urinary urgency, vaginal discharge and vaginal itching. Diet regular.The patient states her exercise level is  moderate.  . The patient's tobacco use is:  Social History   Tobacco Use  Smoking Status Never  Smokeless Tobacco Never  . She has been exposed to passive smoke. The patient's alcohol use is:  Social History   Substance and Sexual  Activity  Alcohol Use Yes   Comment: socially   Review of Systems  Constitutional: Negative.   HENT: Negative.    Eyes: Negative.   Respiratory: Negative.    Cardiovascular: Negative.   Gastrointestinal: Negative.   Endocrine: Negative.   Genitourinary: Negative.   Musculoskeletal: Negative.   Skin: Negative.   Allergic/Immunologic: Negative.   Neurological: Negative.   Hematological: Negative.   Psychiatric/Behavioral: Negative.      Today's Vitals   05/13/21 0910  BP: 136/88  Pulse: 84  Temp: 98.6 F (37 C)  Weight: 185 lb 3.2 oz (84 kg)  Height: 5' 2.2" (1.58 m)   Body mass index is 33.66 kg/m.   Wt Readings from Last 3 Encounters:  05/13/21 185 lb 3.2 oz (84 kg)  10/29/20 188 lb (85.3 kg)  09/30/20 184 lb 6.4 oz (83.6 kg)    Objective:  Physical Exam Vitals and nursing note reviewed.  Constitutional:      Appearance: Normal appearance.  HENT:     Head: Normocephalic and atraumatic.     Right Ear: Tympanic membrane, ear canal and external ear normal.     Left Ear: Tympanic membrane, ear canal and external ear normal.     Nose:     Comments: Masked     Mouth/Throat:     Comments: Masked  Eyes:     Extraocular Movements: Extraocular movements intact.     Conjunctiva/sclera: Conjunctivae normal.     Pupils: Pupils are equal, round, and reactive to light.  Cardiovascular:     Rate and Rhythm: Normal rate and regular rhythm.     Pulses: Normal pulses.     Heart sounds: Normal heart sounds.  Pulmonary:     Effort: Pulmonary effort is normal.     Breath sounds: Normal breath sounds.  Abdominal:     General: Abdomen is flat. Bowel sounds are normal.     Palpations: Abdomen is soft.  Genitourinary:    Comments: deferred Musculoskeletal:        General: Normal range of motion.     Cervical back: Normal range of motion and neck supple.  Skin:    General: Skin is warm and dry.  Neurological:     General: No focal deficit present.     Mental Status: She  is alert and oriented to person, place, and time.  Psychiatric:        Mood and Affect: Mood normal.        Behavior: Behavior normal.        Assessment And Plan:     1. Encounter for general adult medical examination w/o abnormal findings Comments: A full exam was performed. Importance of monthly self breast exams was discussed with the patient. I went over her lab  results in full detail. She is scheduled for GYN exam and mammogram tomorrow with Dr. Su Hilt. PATIENT IS ADVISED TO GET 30-45 MINUTES REGULAR EXERCISE NO LESS THAN FOUR TO FIVE DAYS PER WEEK - BOTH WEIGHTBEARING EXERCISES AND AEROBIC ARE RECOMMENDED.  PATIENT IS ADVISED TO FOLLOW A HEALTHY DIET WITH AT LEAST SIX FRUITS/VEGGIES PER DAY, DECREASE INTAKE OF RED MEAT, AND TO INCREASE FISH INTAKE TO TWO DAYS PER WEEK.  MEATS/FISH SHOULD NOT BE FRIED, BAKED OR BROILED IS PREFERABLE.  IT IS ALSO IMPORTANT TO CUT BACK ON YOUR SUGAR INTAKE. PLEASE AVOID ANYTHING WITH ADDED SUGAR, CORN SYRUP OR OTHER SWEETENERS. IF YOU MUST USE A SWEETENER, YOU CAN TRY STEVIA. IT IS ALSO IMPORTANT TO AVOID ARTIFICIALLY SWEETENERS AND DIET BEVERAGES. LASTLY, I SUGGEST WEARING SPF 50 SUNSCREEN ON EXPOSED PARTS AND ESPECIALLY WHEN IN THE DIRECT SUNLIGHT FOR AN EXTENDED PERIOD OF TIME.  PLEASE AVOID FAST FOOD RESTAURANTS AND INCREASE YOUR WATER INTAKE.  2. Elevated blood pressure reading Comments: She is encouraged to decrease her salt intake. She agrees to rto in four weeks for a nurse visit to re-evaluate this.  3. Elevated liver enzymes Comments: I will check labs as below today. She agrees to rto in four weeks for repeat labs. She is encouraged to decrease alcohol/sugar intake. I will make further recommendations once her labs are available for review.  - ANA, IFA (with reflex) - Gamma GT, GGT (42595) - Liver Profile; Future  4. Class 1 obesity due to excess calories without serious comorbidity with body mass index (BMI) of 33.0 to 33.9 in adult Comments:  She is encouraged to strive for BMI less than 30 to decrease cardiac risk. Advised to aim for at least 150 minutes of exercise per week.   5. Immunization due Comments: I will send rx Shingrix to her local pharmacy.   Patient was given opportunity to ask questions. Patient verbalized understanding of the plan and was able to repeat key elements of the plan. All questions were answered to their satisfaction.   I, [name], MD, have reviewed all documentation for this visit. The documentation on 05/13/21 for the exam, diagnosis, procedures, and orders are all accurate and complete.   THE PATIENT IS ENCOURAGED TO PRACTICE SOCIAL DISTANCING DUE TO THE COVID-19 PANDEMIC.

## 2021-05-15 LAB — HM PAP SMEAR

## 2021-05-16 LAB — GAMMA GT: GGT: 38 IU/L (ref 0–60)

## 2021-05-16 LAB — ANTINUCLEAR ANTIBODIES, IFA: ANA Titer 1: NEGATIVE

## 2021-05-18 LAB — HEPATIC FUNCTION PANEL

## 2021-05-19 ENCOUNTER — Other Ambulatory Visit: Payer: Self-pay

## 2021-05-19 DIAGNOSIS — R7989 Other specified abnormal findings of blood chemistry: Secondary | ICD-10-CM

## 2021-05-19 LAB — HEPATIC FUNCTION PANEL
ALT: 67 IU/L — ABNORMAL HIGH (ref 0–32)
AST: 36 IU/L (ref 0–40)
Albumin: 4 g/dL (ref 3.8–4.8)
Alkaline Phosphatase: 103 IU/L (ref 44–121)
Bilirubin Total: <0.2 mg/dL (ref 0.0–1.2)
Bilirubin, Direct: <0.1 mg/dL (ref 0.00–0.40)
Total Protein: 6.9 g/dL (ref 6.0–8.5)

## 2021-05-19 LAB — SPECIMEN STATUS REPORT

## 2021-06-10 ENCOUNTER — Other Ambulatory Visit: Payer: 59

## 2021-06-10 ENCOUNTER — Ambulatory Visit: Payer: 59

## 2021-06-10 ENCOUNTER — Other Ambulatory Visit: Payer: Self-pay

## 2021-06-10 VITALS — BP 130/80 | HR 74 | Temp 98.0°F | Ht 62.2 in | Wt 185.2 lb

## 2021-06-10 DIAGNOSIS — R748 Abnormal levels of other serum enzymes: Secondary | ICD-10-CM

## 2021-06-10 DIAGNOSIS — R030 Elevated blood-pressure reading, without diagnosis of hypertension: Secondary | ICD-10-CM

## 2021-06-10 NOTE — Progress Notes (Signed)
Patient presents today for a bp check. Her bp today was 130/80 she was advised that her bp should be less than 130/80. If at her next visit it is above 130 at her next visit. Dr Allyne Gee will start her on some meds. She was recommended to drink hibiscus tea and also take magnesium nightly. Pt understood. YL,RMA

## 2021-06-11 LAB — HEPATIC FUNCTION PANEL
ALT: 55 IU/L — ABNORMAL HIGH (ref 0–32)
AST: 30 IU/L (ref 0–40)
Albumin: 4.3 g/dL (ref 3.8–4.8)
Alkaline Phosphatase: 95 IU/L (ref 44–121)
Bilirubin Total: 0.4 mg/dL (ref 0.0–1.2)
Bilirubin, Direct: 0.12 mg/dL (ref 0.00–0.40)
Total Protein: 6.7 g/dL (ref 6.0–8.5)

## 2021-06-11 LAB — HM DEXA SCAN

## 2021-09-03 ENCOUNTER — Encounter: Payer: Self-pay | Admitting: Internal Medicine

## 2021-09-03 ENCOUNTER — Ambulatory Visit: Payer: 59 | Admitting: Internal Medicine

## 2021-09-03 ENCOUNTER — Other Ambulatory Visit: Payer: Self-pay

## 2021-09-03 VITALS — BP 116/78 | HR 84 | Temp 98.4°F | Ht 62.2 in | Wt 182.0 lb

## 2021-09-03 DIAGNOSIS — R7989 Other specified abnormal findings of blood chemistry: Secondary | ICD-10-CM

## 2021-09-03 DIAGNOSIS — E6609 Other obesity due to excess calories: Secondary | ICD-10-CM

## 2021-09-03 DIAGNOSIS — R7309 Other abnormal glucose: Secondary | ICD-10-CM

## 2021-09-03 DIAGNOSIS — Z6833 Body mass index (BMI) 33.0-33.9, adult: Secondary | ICD-10-CM

## 2021-09-03 DIAGNOSIS — J4520 Mild intermittent asthma, uncomplicated: Secondary | ICD-10-CM

## 2021-09-03 DIAGNOSIS — Z23 Encounter for immunization: Secondary | ICD-10-CM

## 2021-09-03 MED ORDER — MONTELUKAST SODIUM 10 MG PO TABS: 10.0000 mg | ORAL_TABLET | Freq: Every day | ORAL | 2 refills | Status: DC

## 2021-09-03 MED ORDER — ATORVASTATIN CALCIUM 40 MG PO TABS: 40.0000 mg | ORAL_TABLET | Freq: Every day | ORAL | 2 refills | Status: DC

## 2021-09-03 NOTE — Progress Notes (Signed)
?[name],acting as a Neurosurgeon for [name], MD.,have documented all relevant documentation on the behalf of [name], MD,as directed by  [name], MD while in the presence of [name], MD.  ?This visit occurred during the SARS-CoV-2 public health emergency.  Safety protocols were in place, including screening questions prior to the visit, additional usage of staff PPE, and extensive cleaning of exam room while observing appropriate contact time as indicated for disinfecting solutions. ? ?Subjective:  ?  ? Patient ID: Maria Kline , female    DOB: 1956/04/03 , 66 y.o.   MRN: [id number] ? ? ?Chief Complaint  ?Patient presents with  ? liver function f/u  ? ? ?HPI ? ?Patient presents today for a 3 month f/u on her liver function. Patient reports her neighbor was burning some wood and it caused her chest to get tight and precipitated a coughing spell. She reports compliance with her inhaler.  ?  ? ?Past Medical History:  ?Diagnosis Date  ? Anemia   ? 55yrs ago  ? Asthma   ? Cataracts, bilateral   ? Chicken pox   ? Complication of anesthesia 2000  ? c-section; excessive shivering  ? Eczema   ? hx of  ? Hx of seasonal allergies   ? Hyperlipidemia   ? Measles   ? Mumps   ? Vitamin D deficiency   ? Vit D on tues and fri  ?  ? ?Family History  ?Problem Relation Age of Onset  ? Hypotension Mother   ? Hypertension Mother   ? Diabetes Mother   ? Dementia Mother   ? GI problems Mother   ? Diabetes Other   ? Stroke Other   ? Hypertension Other   ? Heart disease Other   ? Colon cancer Father   ? Cancer - Colon Father   ? Anesthesia problems Neg Hx   ? Malignant hyperthermia Neg Hx   ? Pseudochol deficiency Neg Hx   ? ? ? ?Current Outpatient Medications:  ?  albuterol (VENTOLIN HFA) 108 (90 Base) MCG/ACT inhaler, TAKE 2 PUFFS BY MOUTH EVERY 6 HOURS AS NEEDED FOR WHEEZE OR SHORTNESS OF BREATH, Disp: 18 each, Rfl: 5 ?  Cholecalciferol (VITAMIN D3) 125 MCG (5000 UT) CAPS, Take by mouth.,  Disp: , Rfl:  ?  MAGNESIUM PO, Take 25 mg by mouth daily at 12 noon., Disp: , Rfl:  ?  Mometasone Furoate (ASMANEX HFA) 100 MCG/ACT AERO, Inhale into the lungs., Disp: , Rfl:  ?  atorvastatin (LIPITOR) 40 MG tablet, Take 1 tablet (40 mg total) by mouth daily., Disp: 90 tablet, Rfl: 2 ?  montelukast (SINGULAIR) 10 MG tablet, Take 1 tablet (10 mg total) by mouth daily., Disp: 90 tablet, Rfl: 2  ? ?Allergies  ?Allergen Reactions  ? Demerol [Meperidine] Itching  ? Hydrocodone Other (See Comments)  ?  Patient states that she is not allergic to this medication  ? Penicillins Other (See Comments)  ?  Childhood reaction ?  ?  ? ?Review of Systems  ?Constitutional: Negative.   ?Respiratory: Negative.    ?Cardiovascular: Negative.   ?Gastrointestinal: Negative.   ?Neurological: Negative.   ?Psychiatric/Behavioral: Negative.     ? ?Today's Vitals  ? 09/03/21 1557  ?BP: 116/78  ?Pulse: 84  ?Temp: 98.4 ?F (36.9 ?C)  ?Weight: 182 lb (82.6 kg)  ?Height: 5' 2.2" (1.58 m)  ?PainSc: 0-No pain  ? ?Body mass index is 33.07 kg/m?.  ?Wt Readings from  Last 3 Encounters:  ?09/03/21 182 lb (82.6 kg)  ?06/10/21 185 lb 3 oz (84 kg)  ?05/13/21 185 lb 3.2 oz (84 kg)  ?  ? ?Objective:  ?Physical Exam ?Vitals and nursing note reviewed.  ?Constitutional:   ?   Appearance: Normal appearance.  ?HENT:  ?   Head: Normocephalic and atraumatic.  ?   Nose:  ?   Comments: Masked  ?   Mouth/Throat:  ?   Comments: Masked  ?Eyes:  ?   Extraocular Movements: Extraocular movements intact.  ?Cardiovascular:  ?   Rate and Rhythm: Normal rate and regular rhythm.  ?   Heart sounds: Normal heart sounds.  ?Pulmonary:  ?   Effort: Pulmonary effort is normal.  ?   Breath sounds: Normal breath sounds.  ?Musculoskeletal:  ?   Cervical back: Normal range of motion.  ?Skin: ?   General: Skin is warm.  ?Neurological:  ?   General: No focal deficit present.  ?   Mental Status: She is alert.  ?Psychiatric:     ?   Mood and Affect: Mood normal.     ?   Behavior: Behavior  normal.  ?  ? ?   ?Assessment And Plan:  ?   ?1. Elevated liver function tests ?Comments: I will recheck LFTs today. She would likely benefit from dandelion root tea.  ?- Liver Profile ? ?2. 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 Her sx are stable, encouraged to avoid triggers.  ? ?3. Other abnormal glucose ?Comments: Her a1c has been elevated in the past. I will recheck this today. She is encouraged to aim for at least 150 minutes of exercise per week.  ?- Hemoglobin A1c ? ?4. Class 1 obesity due to excess calories without serious comorbidity with body mass index (BMI) of 33.0 to 33.9 in adult ?Comments: She is encouraged to strive for BMI<30 to decrease cardiac risk. Advised to aim for at least 150 minutes of exercise per week.  ? ?5. Immunization due ?Comments: She was given Shingrix IM x 1.  ?  ?Patient was given opportunity to ask questions. Patient verbalized understanding of the plan and was able to repeat key elements of the plan. All questions were answered to their satisfaction.  ? ?I, [name], MD, have reviewed all documentation for this visit. The documentation on 09/03/21 for the exam, diagnosis, procedures, and orders are all accurate and complete.  ? ?IF YOU HAVE BEEN REFERRED TO A SPECIALIST, IT MAY TAKE 1-2 WEEKS TO SCHEDULE/PROCESS THE REFERRAL. IF YOU HAVE NOT HEARD FROM US/SPECIALIST IN TWO WEEKS, PLEASE GIVE [place] A CALL AT [phone].  ? ?THE PATIENT IS ENCOURAGED TO PRACTICE SOCIAL DISTANCING DUE TO THE COVID-19 PANDEMIC.   ?

## 2021-09-04 LAB — HEPATIC FUNCTION PANEL
ALT: 55 IU/L — ABNORMAL HIGH (ref 0–32)
AST: 30 IU/L (ref 0–40)
Albumin: 4.5 g/dL (ref 3.8–4.8)
Alkaline Phosphatase: 115 IU/L (ref 44–121)
Bilirubin Total: 0.3 mg/dL (ref 0.0–1.2)
Bilirubin, Direct: <0.1 mg/dL (ref 0.00–0.40)
Total Protein: 6.9 g/dL (ref 6.0–8.5)

## 2021-09-04 LAB — HEMOGLOBIN A1C
Est. average glucose Bld gHb Est-mCnc: 120 mg/dL
Hgb A1c MFr Bld: 5.8 % — ABNORMAL HIGH (ref 4.8–5.6)

## 2021-11-10 ENCOUNTER — Encounter: Payer: Self-pay | Admitting: Internal Medicine

## 2021-11-10 ENCOUNTER — Ambulatory Visit: Payer: 59 | Admitting: Internal Medicine

## 2021-11-10 VITALS — BP 132/84 | HR 70 | Temp 97.6°F | Ht 62.2 in | Wt 182.0 lb

## 2021-11-10 DIAGNOSIS — R030 Elevated blood-pressure reading, without diagnosis of hypertension: Secondary | ICD-10-CM | POA: Diagnosis not present

## 2021-11-10 DIAGNOSIS — E6609 Other obesity due to excess calories: Secondary | ICD-10-CM | POA: Diagnosis not present

## 2021-11-10 DIAGNOSIS — R7989 Other specified abnormal findings of blood chemistry: Secondary | ICD-10-CM

## 2021-11-10 DIAGNOSIS — J4520 Mild intermittent asthma, uncomplicated: Secondary | ICD-10-CM | POA: Diagnosis not present

## 2021-11-10 DIAGNOSIS — Z6833 Body mass index (BMI) 33.0-33.9, adult: Secondary | ICD-10-CM

## 2021-11-10 NOTE — Patient Instructions (Signed)
Asthma, Adult ? ?Asthma is a long-term (chronic) condition that causes recurrent episodes in which the lower airways in the lungs become tight and narrow. The narrowing is caused by inflammation and tightening of the smooth muscle around the lower airways. ?Asthma episodes, also called asthma attacks or asthma flares, may cause coughing, making high-pitched whistling sounds when you breathe, most often when you breathe out (wheezing), shortness of breath, and chest pain. The airways may produce extra mucus caused by the inflammation and irritation. During an attack, it can be difficult to breathe. Asthma attacks can range from minor to life-threatening. ?Asthma cannot be cured, but medicines and lifestyle changes can help control it and treat acute attacks. It is important to keep your asthma well controlled so the condition does not interfere with your daily life. ?What are the causes? ?This condition is believed to be caused by inherited (genetic) and environmental factors, but its exact cause is not known. ?What can trigger an asthma attack? ?Many things can bring on an asthma attack or make symptoms worse. These triggers are different for every person. Common triggers include: ?Allergens and irritants like mold, dust, pet dander, cockroaches, pollen, air pollution, and chemical odors. ?Cigarette smoke. ?Weather changes and cold air. ?Stress and strong emotional responses such as crying or laughing hard. ?Certain medications such as aspirin or beta blockers. ?Infections and inflammatory conditions, such as the flu, a cold, pneumonia, or inflammation of the nasal membranes (rhinitis). ?Gastroesophageal reflux disease (GERD). ?What are the signs or symptoms? ?Symptoms may occur right after exposure to an asthma trigger or hours later and can vary by person. Common signs and symptoms include: ?Wheezing. ?Trouble breathing (shortness of breath). ?Excessive nighttime or early morning coughing. ?Chest  tightness. ?Tiredness (fatigue) with minimal activity. ?Difficulty talking in complete sentences. ?Poor exercise tolerance. ?How is this diagnosed? ?This condition is diagnosed based on: ?A physical exam and your medical history. ?Tests, which may include: ?Lung function studies to evaluate the flow of air in your lungs. ?Allergy tests. ?Imaging tests, such as X-rays. ?How is this treated? ?There is no cure, but symptoms can be controlled with proper treatment. Treatment usually involves: ?Identifying and avoiding your asthma triggers. ?Inhaled medicines. Two types are commonly used to treat asthma, depending on severity: ?Controller medicines. These help prevent asthma symptoms from occurring. They are taken every day. ?Fast-acting reliever or rescue medicines. These quickly relieve asthma symptoms. They are used as needed and provide short-term relief. ?Using other medicines, such as: ?Allergy medicines, such as antihistamines, if your asthma attacks are triggered by allergens. ?Immune medicines (immunomodulators). These are medicines that help control the immune system. ?Using supplemental oxygen. This is only needed during a severe episode. ?Creating an asthma action plan. An asthma action plan is a written plan for managing and treating your asthma attacks. This plan includes: ?A list of your asthma triggers and how to avoid them. ?Information about when medicines should be taken and when their dosage should be changed. ?Instructions about using a device called a peak flow meter. A peak flow meter measures how well the lungs are working and the severity of your asthma. It helps you monitor your condition. ?Follow these instructions at home: ?Take over-the-counter and prescription medicines only as told by your health care provider. ?Stay up to date on all vaccinations as recommended by your healthcare provider, including vaccines for the flu and pneumonia. ?Use a peak flow meter and keep track of your peak flow  readings. ?Understand and use your asthma   action plan to address any asthma flares. ?Do not smoke or allow anyone to smoke in your home. ?Contact a health care provider if: ?You have wheezing, shortness of breath, or a cough that is not responding to medicines. ?Your medicines are causing side effects, such as a rash, itching, swelling, or trouble breathing. ?You need to use a reliever medicine more than 2-3 times a week. ?Your peak flow reading is still at 50-79% of your personal best after following your action plan for 1 hour. ?You have a fever and shortness of breath. ?Get help right away if: ?You are getting worse and do not respond to treatment during an asthma attack. ?You are short of breath when at rest or when doing very little physical activity. ?You have difficulty eating, drinking, or talking. ?You have chest pain or tightness. ?You develop a fast heartbeat or palpitations. ?You have a bluish color to your lips or fingernails. ?You are light-headed or dizzy, or you faint. ?Your peak flow reading is less than 50% of your personal best. ?You feel too tired to breathe normally. ?These symptoms may be an emergency. Get help right away. Call 911. ?Do not wait to see if the symptoms will go away. ?Do not drive yourself to the hospital. ?Summary ?Asthma is a long-term (chronic) condition that causes recurrent episodes in which the airways become tight and narrow. Asthma episodes, also called asthma attacks or asthma flares, can cause coughing, wheezing, shortness of breath, and chest pain. ?Asthma cannot be cured, but medicines and lifestyle changes can help keep it well controlled and prevent asthma flares. ?Make sure you understand how to avoid triggers and how and when to use your medicines. ?Asthma attacks can range from minor to life-threatening. Get help right away if you have an asthma attack and do not respond to treatment with your usual rescue medicines. ?This information is not intended to replace  advice given to you by your health care provider. Make sure you discuss any questions you have with your health care provider. ?Document Revised: 04/02/2021 Document Reviewed: 03/24/2021 ?[employer]vier Patient Education ? 2023 [employer] ? ?

## 2021-11-10 NOTE — Progress Notes (Signed)
[name],acting as a Neurosurgeon for Maria Aliment, MD.,have documented all relevant documentation on the behalf of Maria Aliment, MD,as directed by  Maria Aliment, MD while in the presence of Maria Aliment, MD.  This visit occurred during the SARS-CoV-2 public health emergency.  Safety protocols were in place, including screening questions prior to the visit, additional usage of staff PPE, and extensive cleaning of exam room while observing appropriate contact time as indicated for disinfecting solutions.  Subjective:     Patient ID: Maria Kline , female    DOB: 1955-11-04 , 66 y.o.   MRN: [id number]   Chief Complaint  Patient presents with   Asthma    HPI  The patient is here today for a follow-up on her asthma. She is currently using Asmanex, without any issues. Patient reports not having to use her albuterol inhaler as much. Patient does not have any questions or concerns today.   Asthma There is no shortness of breath. Pertinent negatives include no chest pain, dyspnea on exertion, myalgias or sweats. Her symptoms are alleviated by leukotriene antagonist and steroid inhaler. She reports significant improvement on treatment. Her past medical history is significant for asthma.    Past Medical History:  Diagnosis Date   Anemia    61yrs ago   Asthma    Cataracts, bilateral    Chicken pox    Complication of anesthesia 2000   c-section; excessive shivering   Eczema    hx of   Hx of seasonal allergies    Hyperlipidemia    Measles    Mumps    Vitamin D deficiency    Vit D on tues and fri     Family History  Problem Relation Age of Onset   Hypotension Mother    Hypertension Mother    Diabetes Mother    Dementia Mother    GI problems Mother    Diabetes Other    Stroke Other    Hypertension Other    Heart disease Other    Colon cancer Father    Cancer - Colon Father    Anesthesia problems Neg Hx    Malignant hyperthermia Neg Hx     Pseudochol deficiency Neg Hx      Current Outpatient Medications:    albuterol (VENTOLIN HFA) 108 (90 Base) MCG/ACT inhaler, TAKE 2 PUFFS BY MOUTH EVERY 6 HOURS AS NEEDED FOR WHEEZE OR SHORTNESS OF BREATH, Disp: 18 each, Rfl: 5   atorvastatin (LIPITOR) 40 MG tablet, Take 1 tablet (40 mg total) by mouth daily., Disp: 90 tablet, Rfl: 2   Cholecalciferol (VITAMIN D3) 125 MCG (5000 UT) CAPS, Take by mouth., Disp: , Rfl:    MAGNESIUM PO, Take 25 mg by mouth daily at 12 noon., Disp: , Rfl:    Mometasone Furoate (ASMANEX HFA) 100 MCG/ACT AERO, Inhale into the lungs., Disp: , Rfl:    montelukast (SINGULAIR) 10 MG tablet, Take 1 tablet (10 mg total) by mouth daily., Disp: 90 tablet, Rfl: 2   Allergies  Allergen Reactions   Demerol [Meperidine] Itching   Hydrocodone Other (See Comments)    Patient states that she is not allergic to this medication   Penicillins Other (See Comments)    Childhood reaction      Review of Systems  Constitutional: Negative.   Respiratory: Negative.  Negative for shortness of breath.   Cardiovascular: Negative.  Negative for chest pain and dyspnea on exertion.  Gastrointestinal: Negative.   Musculoskeletal:  Negative for myalgias.  Neurological: Negative.   Psychiatric/Behavioral: Negative.      Today's Vitals   11/10/21 1022  BP: 132/84  Pulse: 70  Temp: 97.6 F (36.4 C)  Weight: 182 lb (82.6 kg)  Height: 5' 2.2" (1.58 m)  PainSc: 0-No pain   Body mass index is 33.07 kg/m.  Wt Readings from Last 3 Encounters:  11/10/21 182 lb (82.6 kg)  09/03/21 182 lb (82.6 kg)  06/10/21 185 lb 3 oz (84 kg)    BP Readings from Last 3 Encounters:  11/10/21 132/84  09/03/21 116/78  06/10/21 130/80     Objective:  Physical Exam Vitals and nursing note reviewed.  Constitutional:      Appearance: Normal appearance.  HENT:     Head: Normocephalic and atraumatic.  Eyes:     Extraocular Movements: Extraocular movements intact.  Cardiovascular:     Rate and  Rhythm: Normal rate and regular rhythm.     Heart sounds: Normal heart sounds.  Pulmonary:     Effort: Pulmonary effort is normal.     Breath sounds: Normal breath sounds.  Musculoskeletal:     Cervical back: Normal range of motion.  Skin:    General: Skin is warm.  Neurological:     General: No focal deficit present.     Mental Status: She is alert.  Psychiatric:        Mood and Affect: Mood normal.        Behavior: Behavior normal.      Assessment And Plan:     1. 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 She will continue Asmanex, inhaler 2 puffs twice daily. She will f/u in 4-6 months.   2. Elevated blood pressure reading Comments: She is encouraged to follow low sodium diet. I will recheck at her next visit.  Goal BP<130/80.   3. Elevated liver function tests Comments: I will recheck this today. She is encouraged to avoid NSAIDs, decrease ETOH and sugar intake.  - Liver Profile - Gamma GT, GGT ([id number])  4. Class 1 obesity due to excess calories without serious comorbidity with body mass index (BMI) of 33.0 to 33.9 in adult She is encouraged to strive for BMI less than 30 to decrease cardiac risk. Advised to aim for at least 150 minutes of exercise per week.     Patient was given opportunity to ask questions. Patient verbalized understanding of the plan and was able to repeat key elements of the plan. All questions were answered to their satisfaction.   I, Maria Aliment, MD, have reviewed all documentation for this visit. The documentation on 11/10/21 for the exam, diagnosis, procedures, and orders are all accurate and complete.   IF YOU HAVE BEEN REFERRED TO A SPECIALIST, IT MAY TAKE 1-2 WEEKS TO SCHEDULE/PROCESS THE REFERRAL. IF YOU HAVE NOT HEARD FROM US/SPECIALIST IN TWO WEEKS, PLEASE GIVE [place] A CALL AT [phone].   THE PATIENT IS ENCOURAGED TO PRACTICE SOCIAL DISTANCING DUE TO THE COVID-19 PANDEMIC.

## 2021-11-11 LAB — HEPATIC FUNCTION PANEL
ALT: 76 IU/L — ABNORMAL HIGH (ref 0–32)
AST: 43 IU/L — ABNORMAL HIGH (ref 0–40)
Albumin: 4.4 g/dL (ref 3.8–4.8)
Alkaline Phosphatase: 122 IU/L — ABNORMAL HIGH (ref 44–121)
Bilirubin Total: 0.6 mg/dL (ref 0.0–1.2)
Bilirubin, Direct: 0.19 mg/dL (ref 0.00–0.40)
Total Protein: 7 g/dL (ref 6.0–8.5)

## 2021-11-11 LAB — GAMMA GT: GGT: 51 IU/L (ref 0–60)

## 2021-11-21 ENCOUNTER — Other Ambulatory Visit: Payer: Self-pay | Admitting: Internal Medicine

## 2021-11-21 DIAGNOSIS — R7989 Other specified abnormal findings of blood chemistry: Secondary | ICD-10-CM

## 2021-11-26 ENCOUNTER — Ambulatory Visit
Admission: RE | Admit: 2021-11-26 | Discharge: 2021-11-26 | Disposition: A | Discharge status: Home / Self Care | Payer: 59 | Source: Ambulatory Visit | Attending: Internal Medicine | Admitting: Internal Medicine

## 2021-11-26 DIAGNOSIS — R7989 Other specified abnormal findings of blood chemistry: Secondary | ICD-10-CM

## 2021-12-01 ENCOUNTER — Telehealth: Payer: Self-pay

## 2021-12-01 NOTE — Telephone Encounter (Signed)
RUQ ultrasound results: findings suggestive of fatty liver. It is important to decrease alcohol and sugar intake. Be sure to also aim for at least 150 minutes of exercise per week.   I left pt vm to call the office [facility] ([place])

## 2021-12-03 ENCOUNTER — Telehealth: Payer: Self-pay

## 2021-12-03 NOTE — Telephone Encounter (Signed)
Patient notified of her ultrasound results. YL,RMA

## 2022-02-17 ENCOUNTER — Ambulatory Visit: Payer: 59 | Admitting: Internal Medicine

## 2022-02-17 ENCOUNTER — Encounter: Payer: Self-pay | Admitting: Internal Medicine

## 2022-02-17 VITALS — BP 122/88 | HR 92 | Temp 98.2°F | Ht 62.0 in | Wt 182.0 lb

## 2022-02-17 DIAGNOSIS — J208 Acute bronchitis due to other specified organisms: Secondary | ICD-10-CM | POA: Diagnosis not present

## 2022-02-17 DIAGNOSIS — R0982 Postnasal drip: Secondary | ICD-10-CM | POA: Diagnosis not present

## 2022-02-17 DIAGNOSIS — E6609 Other obesity due to excess calories: Secondary | ICD-10-CM

## 2022-02-17 LAB — POC COVID19 BINAXNOW: SARS Coronavirus 2 Ag: NEGATIVE

## 2022-02-17 MED ORDER — HYDROCODONE BIT-HOMATROP MBR 5-1.5 MG/5ML PO SOLN: 5.0000 mL | Freq: Four times a day (QID) | ORAL | 0 refills | Status: DC | PRN

## 2022-02-17 MED ORDER — TRIAMCINOLONE ACETONIDE 40 MG/ML IJ SUSP
60.0000 mg | Freq: Once | INTRAMUSCULAR | Status: AC
  Administered 2022-02-17: 60 mg via INTRAMUSCULAR

## 2022-02-17 NOTE — Progress Notes (Signed)
I,[name],acting as a scribe for [name], MD.,have documented all relevant documentation on the behalf of [name], MD,as directed by  [name], MD while in the presence of [name], MD.    Subjective:     Patient ID: Maria Kline , female    DOB: March 26, 1956 , 66 y.o.   MRN: [id number]   Chief Complaint  Patient presents with   Cough    HPI  Pt presents today for cough. She reports symptom initially started on Thursday. Denies fever/chills/loss of taste/smell/fatigue. She developed a wet cough  on Sunday. She denies ill contacts. She states she has difficulty in coughing up the phlegm.  She has been using her albuterol inhaler & gargling salt warm salt water. She denies changes in her daily routine; however, adds that she recently had to sleep with her windows open because her [facility] went out. She does have h/o allergies.   She is going out of town this upcoming Thursday.    Cough This is a new problem. The current episode started 1 to 4 weeks ago. The problem has been unchanged. The cough is Productive of sputum. Pertinent negatives include no chills, ear congestion, ear pain, fever or headaches. She has tried a beta-agonist inhaler for the symptoms.     Past Medical History:  Diagnosis Date   Anemia    94yrs ago   Asthma    Cataracts, bilateral    Chicken pox    Complication of anesthesia 2000   c-section; excessive shivering   Eczema    hx of   Hx of seasonal allergies    Hyperlipidemia    Measles    Mumps    Vitamin D deficiency    Vit D on tues and fri     Family History  Problem Relation Age of Onset   Hypotension Mother    Hypertension Mother    Diabetes Mother    Dementia Mother    GI problems Mother    Diabetes Other    Stroke Other    Hypertension Other    Heart disease Other    Colon cancer Father    Cancer - Colon Father    Anesthesia problems Neg Hx    Malignant hyperthermia Neg Hx    Pseudochol  deficiency Neg Hx      Current Outpatient Medications:    albuterol (VENTOLIN HFA) 108 (90 Base) MCG/ACT inhaler, TAKE 2 PUFFS BY MOUTH EVERY 6 HOURS AS NEEDED FOR WHEEZE OR SHORTNESS OF BREATH, Disp: 18 each, Rfl: 5   atorvastatin (LIPITOR) 40 MG tablet, Take 1 tablet (40 mg total) by mouth daily., Disp: 90 tablet, Rfl: 2   Cholecalciferol (VITAMIN D3) 125 MCG (5000 UT) CAPS, Take by mouth., Disp: , Rfl:    HYDROcodone bit-homatropine (HYDROMET) 5-1.5 MG/5ML syrup, Take 5 mLs by mouth every 6 (six) hours as needed for cough., Disp: 120 mL, Rfl: 0   MAGNESIUM PO, Take 25 mg by mouth daily at 12 noon., Disp: , Rfl:    Mometasone Furoate (ASMANEX HFA) 100 MCG/ACT AERO, Inhale into the lungs., Disp: , Rfl:    montelukast (SINGULAIR) 10 MG tablet, Take 1 tablet (10 mg total) by mouth daily., Disp: 90 tablet, Rfl: 2   Allergies  Allergen Reactions   Demerol [Meperidine] Itching   Hydrocodone Other (See Comments)    Patient states that she is not allergic to this medication   Penicillins Other (See Comments)  Childhood reaction      Review of Systems  Constitutional: Negative.  Negative for chills and fever.  HENT:  Negative for ear pain.   Respiratory:  Positive for cough.   Cardiovascular: Negative.   Neurological: Negative.  Negative for headaches.  Psychiatric/Behavioral: Negative.       Today's Vitals   02/17/22 1434 02/17/22 1501  BP: (!) 122/100 122/88  Pulse: 92   Temp: 98.2 F (36.8 C)   SpO2: 98%   Weight: 182 lb (82.6 kg)   Height: 5\' 2"  (1.575 m)   PainSc: 0-No pain    Body mass index is 33.29 kg/m.  Wt Readings from Last 3 Encounters:  02/17/22 182 lb (82.6 kg)  11/10/21 182 lb (82.6 kg)  09/03/21 182 lb (82.6 kg)    Objective:  Physical Exam Vitals and nursing note reviewed.  Constitutional:      Appearance: Normal appearance.  HENT:     Head: Normocephalic and atraumatic.     Right Ear: Tympanic membrane, ear canal and external ear normal. There is  no impacted cerumen.     Left Ear: Tympanic membrane, ear canal and external ear normal. There is no impacted cerumen.     Nose:     Comments: Masked     Mouth/Throat:     Comments: Masked  Eyes:     Extraocular Movements: Extraocular movements intact.  Cardiovascular:     Rate and Rhythm: Normal rate and regular rhythm.     Heart sounds: Normal heart sounds.  Pulmonary:     Effort: Pulmonary effort is normal.     Breath sounds: Normal breath sounds.  Musculoskeletal:     Cervical back: Normal range of motion.  Skin:    General: Skin is warm.  Neurological:     General: No focal deficit present.     Mental Status: She is alert.  Psychiatric:        Mood and Affect: Mood normal.        Behavior: Behavior normal.         Assessment And Plan:     1. Acute bronchitis due to other specified organisms Comments: She was given Kenalog 40mg  IM x 1, reminded to avoid dairy. Agrees to COVID testing. Will send rx hydromet syrup to use prn.  - POC COVID-19 BinaxNow - Novel Coronavirus, NAA ([facility]) - triamcinolone acetonide (KENALOG-40) injection 60 mg  2. Postnasal drip Comments: I recommend Zyrtec nightly and avoidance of dairy for 1-2 weeks.   Patient was given opportunity to ask questions. Patient verbalized understanding of the plan and was able to repeat key elements of the plan. All questions were answered to their satisfaction.   I, 11/03/21, MD, have reviewed all documentation for this visit. The documentation on 02/17/22 for the exam, diagnosis, procedures, and orders are all accurate and complete.   IF YOU HAVE BEEN REFERRED TO A SPECIALIST, IT MAY TAKE 1-2 WEEKS TO SCHEDULE/PROCESS THE REFERRAL. IF YOU HAVE NOT HEARD FROM US/SPECIALIST IN TWO WEEKS, PLEASE GIVE [name] A CALL AT [phone].   THE PATIENT IS ENCOURAGED TO PRACTICE SOCIAL DISTANCING DUE TO THE COVID-19 PANDEMIC.

## 2022-02-17 NOTE — Patient Instructions (Signed)

## 2022-02-19 LAB — NOVEL CORONAVIRUS, NAA: SARS-CoV-2, NAA: NOT DETECTED

## 2022-02-20 ENCOUNTER — Encounter: Payer: Self-pay | Admitting: Internal Medicine

## 2022-03-05 LAB — HM COLONOSCOPY

## 2022-03-06 ENCOUNTER — Encounter: Payer: Self-pay | Admitting: Internal Medicine

## 2022-03-09 DIAGNOSIS — R0982 Postnasal drip: Secondary | ICD-10-CM | POA: Insufficient documentation

## 2022-03-09 DIAGNOSIS — J208 Acute bronchitis due to other specified organisms: Secondary | ICD-10-CM | POA: Insufficient documentation

## 2022-03-10 ENCOUNTER — Encounter: Payer: Self-pay | Admitting: Internal Medicine

## 2022-05-18 LAB — HM MAMMOGRAPHY

## 2022-05-19 ENCOUNTER — Other Ambulatory Visit: Payer: 59

## 2022-05-19 DIAGNOSIS — E6609 Other obesity due to excess calories: Secondary | ICD-10-CM

## 2022-05-19 DIAGNOSIS — R7309 Other abnormal glucose: Secondary | ICD-10-CM

## 2022-05-19 DIAGNOSIS — E7800 Pure hypercholesterolemia, unspecified: Secondary | ICD-10-CM

## 2022-05-19 LAB — CMP14+EGFR
ALT: 77 IU/L — ABNORMAL HIGH (ref 0–32)
AST: 39 IU/L (ref 0–40)
Albumin/Globulin Ratio: 1.7 (ref 1.2–2.2)
Albumin: 4.3 g/dL (ref 3.9–4.9)
Alkaline Phosphatase: 137 IU/L — ABNORMAL HIGH (ref 44–121)
BUN/Creatinine Ratio: 16 (ref 12–28)
BUN: 11 mg/dL (ref 8–27)
Bilirubin Total: 0.5 mg/dL (ref 0.0–1.2)
CO2: 24 mmol/L (ref 20–29)
Calcium: 9.3 mg/dL (ref 8.7–10.3)
Chloride: 102 mmol/L (ref 96–106)
Creatinine, Ser: 0.68 mg/dL (ref 0.57–1.00)
Globulin, Total: 2.5 g/dL (ref 1.5–4.5)
Glucose: 78 mg/dL (ref 70–99)
Potassium: 4.2 mmol/L (ref 3.5–5.2)
Sodium: 140 mmol/L (ref 134–144)
Total Protein: 6.8 g/dL (ref 6.0–8.5)
eGFR: 97 mL/min/{1.73_m2} (ref >59)

## 2022-05-19 LAB — HEMOGLOBIN A1C
Est. average glucose Bld gHb Est-mCnc: 126 mg/dL
Hgb A1c MFr Bld: 6 % — ABNORMAL HIGH (ref 4.8–5.6)

## 2022-05-19 LAB — CBC
Hematocrit: 40.8 % (ref 34.0–46.6)
Hemoglobin: 13.7 g/dL (ref 11.1–15.9)
MCH: 29 pg (ref 26.6–33.0)
MCHC: 33.6 g/dL (ref 31.5–35.7)
MCV: 86 fL (ref 79–97)
Platelets: 244 10*3/uL (ref 150–450)
RBC: 4.73 x10E6/uL (ref 3.77–5.28)
RDW: 13.2 % (ref 11.7–15.4)
WBC: 3.7 10*3/uL (ref 3.4–10.8)

## 2022-05-19 LAB — LIPID PANEL
Chol/HDL Ratio: 1.8 ratio (ref 0.0–4.4)
Cholesterol, Total: 136 mg/dL (ref 100–199)
HDL: 75 mg/dL (ref >39)
LDL Chol Calc (NIH): 49 mg/dL (ref 0–99)
Triglycerides: 53 mg/dL (ref 0–149)
VLDL Cholesterol Cal: 12 mg/dL (ref 5–40)

## 2022-05-26 ENCOUNTER — Ambulatory Visit (INDEPENDENT_AMBULATORY_CARE_PROVIDER_SITE_OTHER): Payer: 59 | Admitting: Internal Medicine

## 2022-05-26 ENCOUNTER — Other Ambulatory Visit: Payer: Self-pay | Admitting: Internal Medicine

## 2022-05-26 ENCOUNTER — Encounter: Payer: Self-pay | Admitting: Internal Medicine

## 2022-05-26 VITALS — BP 136/88 | HR 85 | Temp 98.1°F | Ht 62.0 in | Wt 186.0 lb

## 2022-05-26 DIAGNOSIS — Z0000 Encounter for general adult medical examination without abnormal findings: Secondary | ICD-10-CM

## 2022-05-26 DIAGNOSIS — E7800 Pure hypercholesterolemia, unspecified: Secondary | ICD-10-CM

## 2022-05-26 DIAGNOSIS — E559 Vitamin D deficiency, unspecified: Secondary | ICD-10-CM

## 2022-05-26 DIAGNOSIS — R748 Abnormal levels of other serum enzymes: Secondary | ICD-10-CM | POA: Diagnosis not present

## 2022-05-26 DIAGNOSIS — M810 Age-related osteoporosis without current pathological fracture: Secondary | ICD-10-CM

## 2022-05-26 DIAGNOSIS — R0683 Snoring: Secondary | ICD-10-CM

## 2022-05-26 DIAGNOSIS — H409 Unspecified glaucoma: Secondary | ICD-10-CM

## 2022-05-26 NOTE — Progress Notes (Signed)
c I,Ciera J Martin,acting as a Neurosurgeon for [name], MD.,have documented all relevant documentation on the behalf of [name], MD,as directed by  [name], MD while in the presence of [name], MD.   Subjective:     Patient ID: Maria Kline , female    DOB: [birth year] , 66 y.o.   MRN: [id number]   Chief Complaint  Patient presents with   Annual Exam    HPI  She is here today for a full physical examination. She is followed by Dr. Su Hilt for her GYN evaluations. Patient states she would like to be checked for sleep apnea. She states her husband has witnessed her having apneic episodes. She denies nocturia, early am headaches - but states she snores when really tired.   BP Readings from Last 3 Encounters: 05/26/22 : 136/88 02/17/22 : 122/88 11/10/21 : 132/84       Past Medical History:  Diagnosis Date   Anemia    44yrs ago   Asthma    Cataracts, bilateral    Chicken pox    Complication of anesthesia 2000   c-section; excessive shivering   Eczema    hx of   Hx of seasonal allergies    Hyperlipidemia    Measles    Mumps    Vitamin D deficiency    Vit D on tues and fri     Family History  Problem Relation Age of Onset   Hypotension Mother    Hypertension Mother    Diabetes Mother    Dementia Mother    GI problems Mother    Diabetes Other    Stroke Other    Hypertension Other    Heart disease Other    Colon cancer Father    Cancer - Colon Father    Anesthesia problems Neg Hx    Malignant hyperthermia Neg Hx    Pseudochol deficiency Neg Hx      Current Outpatient Medications:    albuterol (VENTOLIN HFA) 108 (90 Base) MCG/ACT inhaler, TAKE 2 PUFFS BY MOUTH EVERY 6 HOURS AS NEEDED FOR WHEEZE OR SHORTNESS OF BREATH, Disp: 18 each, Rfl: 5   atorvastatin (LIPITOR) 40 MG tablet, Take 1 tablet (40 mg total) by mouth daily., Disp: 90 tablet, Rfl: 2   Cholecalciferol (VITAMIN D3) 125 MCG (5000 UT) CAPS, Take by mouth., Disp: , Rfl:     HYDROcodone bit-homatropine (HYDROMET) 5-1.5 MG/5ML syrup, Take 5 mLs by mouth every 6 (six) hours as needed for cough., Disp: 120 mL, Rfl: 0   MAGNESIUM PO, Take 25 mg by mouth daily at 12 noon., Disp: , Rfl:    Mometasone Furoate (ASMANEX HFA) 100 MCG/ACT AERO, Inhale into the lungs., Disp: , Rfl:    montelukast (SINGULAIR) 10 MG tablet, Take 1 tablet (10 mg total) by mouth daily., Disp: 90 tablet, Rfl: 2   Allergies  Allergen Reactions   Demerol [Meperidine] Itching   Hydrocodone Other (See Comments)    Patient states that she is not allergic to this medication   Penicillins Other (See Comments)    Childhood reaction       The patient states she uses post menopausal status for birth control. Last LMP was Patient's last menstrual period was 09/20/2010.. Negative for Dysmenorrhea. Negative for: breast discharge, breast lump(s), breast pain and breast self exam. Associated symptoms include abnormal vaginal bleeding. Pertinent negatives include abnormal bleeding (hematology), anxiety, decreased libido, depression, difficulty falling sleep, dyspareunia, history of infertility, nocturia, sexual  dysfunction, sleep disturbances, urinary incontinence, urinary urgency, vaginal discharge and vaginal itching. Diet regular.The patient states her exercise level is  moderate.  . The patient's tobacco use is:  Social History   Tobacco Use  Smoking Status Never  Smokeless Tobacco Never  . She has been exposed to passive smoke. The patient's alcohol use is:  Social History   Substance and Sexual Activity  Alcohol Use Yes   Comment: socially   Review of Systems  Constitutional: Negative.   HENT: Negative.    Eyes: Negative.   Respiratory: Negative.    Cardiovascular: Negative.   Gastrointestinal: Negative.   Endocrine: Negative.   Genitourinary: Negative.   Musculoskeletal: Negative.   Skin: Negative.   Allergic/Immunologic: Negative.   Neurological: Negative.   Hematological:  Negative.   Psychiatric/Behavioral: Negative.       Today's Vitals   05/26/22 1103  BP: 136/88  Pulse: 85  Temp: 98.1 F (36.7 C)  TempSrc: Oral  Weight: 186 lb (84.4 kg)  Height: 5\' 2"  (1.575 m)  PainSc: 0-No pain   Body mass index is 34.02 kg/m.  Wt Readings from Last 3 Encounters:  05/26/22 186 lb (84.4 kg)  02/17/22 182 lb (82.6 kg)  11/10/21 182 lb (82.6 kg)    Objective:  Physical Exam Vitals and nursing note reviewed.  Constitutional:      Appearance: Normal appearance.  HENT:     Head: Normocephalic and atraumatic.     Right Ear: Tympanic membrane, ear canal and external ear normal.     Left Ear: Tympanic membrane, ear canal and external ear normal.     Nose:     Comments: Masked     Mouth/Throat:     Comments: Masked  Eyes:     Extraocular Movements: Extraocular movements intact.     Conjunctiva/sclera: Conjunctivae normal.     Pupils: Pupils are equal, round, and reactive to light.  Cardiovascular:     Rate and Rhythm: Normal rate and regular rhythm.     Pulses: Normal pulses.     Heart sounds: Normal heart sounds.  Pulmonary:     Effort: Pulmonary effort is normal.     Breath sounds: Normal breath sounds.  Chest:  Breasts:    Tanner Score is 5.     Right: Normal.     Left: Normal.  Abdominal:     General: Abdomen is flat. Bowel sounds are normal.     Palpations: Abdomen is soft.  Genitourinary:    Comments: deferred Musculoskeletal:        General: Normal range of motion.     Cervical back: Normal range of motion and neck supple.  Skin:    General: Skin is warm and dry.  Neurological:     General: No focal deficit present.     Mental Status: She is alert and oriented to person, place, and time.  Psychiatric:        Mood and Affect: Mood normal.        Behavior: Behavior normal.       Assessment And Plan:     1. Annual physical exam Comments: A full exam was performed. Importance of monthly self breast exams was d/w pt. She had labs  drawn last week, we discussed results in detail during her visit.  PATIENT IS ADVISED TO GET 30-45 MINUTES REGULAR EXERCISE NO LESS THAN FOUR TO FIVE DAYS PER WEEK - BOTH WEIGHTBEARING EXERCISES AND AEROBIC ARE RECOMMENDED.  PATIENT IS ADVISED TO FOLLOW A HEALTHY DIET  WITH AT LEAST SIX FRUITS/VEGGIES PER DAY, DECREASE INTAKE OF RED MEAT, AND TO INCREASE FISH INTAKE TO TWO DAYS PER WEEK.  MEATS/FISH SHOULD NOT BE FRIED, BAKED OR BROILED IS PREFERABLE.  IT IS ALSO IMPORTANT TO CUT BACK ON YOUR SUGAR INTAKE. PLEASE AVOID ANYTHING WITH ADDED SUGAR, CORN SYRUP OR OTHER SWEETENERS. IF YOU MUST USE A SWEETENER, YOU CAN TRY STEVIA. IT IS ALSO IMPORTANT TO AVOID ARTIFICIALLY SWEETENERS AND DIET BEVERAGES. LASTLY, I SUGGEST WEARING SPF 50 SUNSCREEN ON EXPOSED PARTS AND ESPECIALLY WHEN IN THE DIRECT SUNLIGHT FOR AN EXTENDED PERIOD OF TIME.  PLEASE AVOID FAST FOOD RESTAURANTS AND INCREASE YOUR WATER INTAKE.   2. Pure hypercholesterolemia Comments: Chronic, currently on statin therapy.  She will c/w atorvastatin 40mg  daily for now. - TSH  3. Snoring Comments: I will refer her to Neuro as requested for sleep study evaluation. - Ambulatory referral to Neurology  4. Elevated alkaline phosphatase level Comments: Seen on recently drawn labs, will check vitamin D level and isoenzymes today. - Alkaline Phosphatase, Isoenzymes  5. Vitamin D deficiency disease Comments: I will check vitamin D level and supplement as needed. - Vitamin D (25 hydroxy)  6. Osteoporosis of lumbar spine Comments: Last Dexa from Nov 2021 results reviewed. She reports having one in 2022 w/ GYN, will request. (this would be earlier than usual time period). - Ambulatory referral to Rheumatology  7. Glaucoma, unspecified glaucoma type, unspecified laterality Comments: Chronic, requests referral to new provider. - Ambulatory referral to Ophthalmology  Patient was given opportunity to ask questions. Patient verbalized understanding of the  plan and was able to repeat key elements of the plan. All questions were answered to their satisfaction.   I, 2023, MD, have reviewed all documentation for this visit. The documentation on 05/26/22 for the exam, diagnosis, procedures, and orders are all accurate and complete.   THE PATIENT IS ENCOURAGED TO PRACTICE SOCIAL DISTANCING DUE TO THE COVID-19 PANDEMIC.

## 2022-05-26 NOTE — Patient Instructions (Signed)

## 2022-05-29 LAB — ALKALINE PHOSPHATASE, ISOENZYMES
Alkaline Phosphatase: 143 IU/L — ABNORMAL HIGH (ref 44–121)
BONE FRACTION: 19 % (ref 14–68)
INTESTINAL FRAC.: 8 % (ref 0–18)
LIVER FRACTION: 73 % (ref 18–85)

## 2022-05-29 LAB — VITAMIN D 25 HYDROXY (VIT D DEFICIENCY, FRACTURES): Vit D, 25-Hydroxy: 56.4 ng/mL (ref 30.0–100.0)

## 2022-05-29 LAB — TSH: TSH: 1.89 u[IU]/mL (ref 0.450–4.500)

## 2022-06-10 ENCOUNTER — Other Ambulatory Visit: Payer: Self-pay | Admitting: Obstetrics and Gynecology

## 2022-06-10 DIAGNOSIS — E049 Nontoxic goiter, unspecified: Secondary | ICD-10-CM

## 2022-06-11 ENCOUNTER — Other Ambulatory Visit: Payer: Self-pay | Admitting: Internal Medicine

## 2022-06-11 DIAGNOSIS — R748 Abnormal levels of other serum enzymes: Secondary | ICD-10-CM

## 2022-06-18 ENCOUNTER — Other Ambulatory Visit: Payer: 59

## 2022-06-18 DIAGNOSIS — R748 Abnormal levels of other serum enzymes: Secondary | ICD-10-CM

## 2022-06-19 LAB — MITOCHONDRIAL/SMOOTH MUSCLE AB PNL
Mitochondrial Ab: <20 Units (ref 0.0–20.0)
Smooth Muscle Ab: 7 Units (ref 0–19)

## 2022-06-25 ENCOUNTER — Ambulatory Visit
Admission: RE | Admit: 2022-06-25 | Discharge: 2022-06-25 | Disposition: A | Discharge status: Home / Self Care | Payer: 59 | Source: Ambulatory Visit | Attending: Obstetrics and Gynecology | Admitting: Obstetrics and Gynecology

## 2022-06-25 DIAGNOSIS — E049 Nontoxic goiter, unspecified: Secondary | ICD-10-CM

## 2022-07-21 ENCOUNTER — Institutional Professional Consult (permissible substitution): Payer: 59 | Admitting: Neurology

## 2022-08-10 ENCOUNTER — Encounter: Payer: Self-pay | Admitting: Neurology

## 2022-08-10 ENCOUNTER — Ambulatory Visit (INDEPENDENT_AMBULATORY_CARE_PROVIDER_SITE_OTHER): Payer: 59 | Admitting: Neurology

## 2022-08-10 VITALS — BP 158/91 | HR 65 | Ht 62.75 in | Wt 187.0 lb

## 2022-08-10 DIAGNOSIS — E669 Obesity, unspecified: Secondary | ICD-10-CM

## 2022-08-10 DIAGNOSIS — Z9189 Other specified personal risk factors, not elsewhere classified: Secondary | ICD-10-CM

## 2022-08-10 DIAGNOSIS — R030 Elevated blood-pressure reading, without diagnosis of hypertension: Secondary | ICD-10-CM

## 2022-08-10 DIAGNOSIS — R0683 Snoring: Secondary | ICD-10-CM | POA: Diagnosis not present

## 2022-08-10 DIAGNOSIS — R0681 Apnea, not elsewhere classified: Secondary | ICD-10-CM | POA: Diagnosis not present

## 2022-08-10 NOTE — Progress Notes (Signed)
Subjective:    Patient ID: Maria Kline is a 67 y.o. female.  HPI    Star Age, MD, PhD [facility] [address] [place], [place] [address]  Dear Dr. Baird Cancer,  I saw your patient, Maria Kline, upon your kind request in my sleep clinic today for initial consultation of her sleep disorder, in particular, concern for obstructive sleep apnea.  The patient is unaccompanied today.  As you know, Ms. Maria Kline is a 67 year old female with an underlying medical history of asthma, anemia, eczema, allergies, hyperlipidemia, vitamin D deficiency, and mild obesity, who reports snoring, difficulty maintaining sleep as well as witnessed apneas per husband's report.  Her Epworth sleepiness score is 3 out of 24, fatigue severity score is 17 out of 63.  I reviewed your office note from 05/26/2022. She is not aware of any family history of sleep apnea but is familiar with the diagnosis as her husband has a CPAP machine.  She would be willing to get tested and try PAP therapy.  She has trouble maintaining sleep, no trouble falling asleep typically.  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  She is phasing out her 6-year term.  She is a non-smoker and drinks alcohol infrequently, maybe once a month and caffeine in the form of coffee, about a 12 ounce cup per day.  Bedtime is around midnight and rise time around 8.  She has slowly gained weight over time.  She denies recurrent morning headaches or night to night nocturia.  She had a tonsillectomy and adenoidectomy at about 67 years of age.  She has a history of allergies and postnasal drip and congestion, takes allergy medicine and has usually well-controlled asthma, has a rescue inhaler.  She occasionally takes  melatonin at night or uses other over-the-counter measures for sleep including sleepy time tea or milk with turmeric.  She takes magnesium daily.  Her Past Medical History Is Significant For: Past Medical History:  Diagnosis Date   Anemia    22yr ago   Asthma    Cataracts, bilateral    Chicken pox    Complication of anesthesia 2000   c-section; excessive shivering   Eczema    hx of   Hx of seasonal allergies    Hyperlipidemia    Measles    Mumps    Vitamin D deficiency    Vit D on tues and fri    Her Past Surgical History Is Significant For: Past Surgical History:  Procedure Laterality Date   adenodectomy     CAPSULOTOMY  03/10/2012   Procedure: MINOR CAPSULOTOMY;  Surgeon: TMyrtha Mantis, MD;  [facility];  Service: Ophthalmology;  Laterality: Left;  Left Eye   CATARACT EXTRACTION W/PHACO  11/25/2011   Procedure: CATARACT EXTRACTION PHACO AND INTRAOCULAR LENS PLACEMENT (IOC);  Surgeon: GAdonis Brook MD;  [facility]  Service: Ophthalmology;  Laterality: Left;   CATARACT EXTRACTION W/PHACO  12/07/2011   Procedure: CATARACT EXTRACTION PHACO AND INTRAOCULAR LENS PLACEMENT (IOC);  Surgeon: GAdonis Brook MD;  [facility]  Service: Ophthalmology;  Laterality: Right;   cataract surgery      left   [facility]URGERY Bilateral 02/2018   for glaucoma, performed by Dr. Venetia Maxon   TONSILLECTOMY AND ADENOIDECTOMY  1964   as a child   WISDOM TOOTH EXTRACTION  1970    Her Family History Is Significant For: Family History  Problem Relation Age of Onset   Hypotension Mother    Hypertension Mother    Diabetes Mother    Dementia Mother    GI problems Mother    Colon cancer Father    Cancer - Colon Father    Hypertension Sister    Hypertension Paternal Grandmother    Hypertension Paternal Grandfather    Diabetes Other    Stroke Other    Hypertension Other    Heart disease Other    Anesthesia  problems Neg Hx    Malignant hyperthermia Neg Hx    Pseudochol deficiency Neg Hx     Her Social History Is Significant For: Social History   Socioeconomic History   Marital status: Married    Spouse name: Not on file   Number of children: Not on file   Years of education: Not on file   Highest education level: Not on file  Occupational History   Not on file  Tobacco Use   Smoking status: Never   Smokeless tobacco: Never  Vaping Use   Vaping Use: Never used  Substance and Sexual Activity   Alcohol use: Yes    Comment: once month   Drug use: No   Sexual activity: Yes    Birth control/protection: Pill  Other Topics Concern   Not on file  Social History Narrative   Caffiene; 12 oz every morning.     Working: Nurse, adult   Married with one adult child.   Husband as CPAP   Social Determinants of Health   Financial Resource Strain: Not on file  Food Insecurity: Not on file  Transportation Needs: Not on file  Physical Activity: Not on file  Stress: Not on file  Social Connections: Not on file    Her Allergies Are:  Allergies  Allergen Reactions   Demerol [Meperidine] Itching   Hydrocodone Other (See Comments)    Patient states that she is not allergic to this medication   Penicillins Other (See Comments)    Childhood reaction   :   Her Current Medications Are:  Outpatient Encounter Medications as of 08/10/2022  Medication Sig   albuterol (VENTOLIN HFA) 108 (90 Base) MCG/ACT inhaler TAKE 2 PUFFS BY MOUTH EVERY 6 HOURS AS NEEDED FOR WHEEZE OR SHORTNESS OF BREATH   atorvastatin (LIPITOR) 40 MG tablet Take 1 tablet (40 mg total) by mouth daily.   cetirizine (ZYRTEC) 10 MG tablet Take 10 mg by mouth at bedtime.   Cholecalciferol (VITAMIN D3) 125 MCG (5000 UT) CAPS Take by mouth.   HYDROcodone bit-homatropine (HYDROMET) 5-1.5 MG/5ML syrup Take 5 mLs by mouth every 6 (six) hours as needed for cough.   MAGNESIUM PO Take 25 mg by mouth daily at 12 noon.    Mometasone Furoate (ASMANEX HFA) 100 MCG/ACT AERO Inhale into the lungs.   montelukast (SINGULAIR) 10 MG tablet Take 1 tablet (10 mg total) by mouth daily.   No facility-administered encounter medications on file as of 08/10/2022.  :   Review of Systems:  Out of a complete 14 point review of systems, all are reviewed and negative with the exception of these symptoms as listed below:   Review of Systems  Neurological:        Snoring,  stops breathing  at night. ESS  3,  FSS 17.     Objective:  Neurological Exam  Physical Exam Physical Examination:   Vitals:   08/10/22 0858  BP: (!) 151/91  Pulse: 68    General Examination: The patient is a very pleasant 67 y.o. female in no acute distress. She appears well-developed and well-nourished and well groomed.   HEENT: Normocephalic, atraumatic, pupils are equal, round and reactive to light, extraocular tracking is good without limitation to gaze excursion or nystagmus noted. Hearing is grossly intact. Face is symmetric with normal facial animation. Speech is clear with no dysarthria noted. There is no hypophonia. There is no lip, neck/head, jaw or voice tremor. Neck is supple with full range of passive and active motion. There are no carotid bruits on auscultation. Oropharynx exam reveals: No significant mouth dryness, good dental hygiene, moderate airway crowding secondary to small airway entry, wider uvula and wider tongue.  Tonsils absent, Mallampati class II, neck circumference 15 inches.  She has a mild to moderate overbite.  Tongue protrudes centrally and palate elevates symmetrically.    Chest: Clear to auscultation without wheezing, rhonchi or crackles noted.  Heart: S1+S2+0, regular and normal without murmurs, rubs or gallops noted.   Abdomen: Soft, non-tender and non-distended.  Extremities: There is no pitting edema in the distal lower extremities bilaterally.   Skin: Warm and dry without trophic changes noted.    Musculoskeletal: exam reveals no obvious joint deformities.   Neurologically:  Mental status: The patient is awake, alert and oriented in all 4 spheres. Her immediate and remote memory, attention, language skills and fund of knowledge are appropriate. There is no evidence of aphasia, agnosia, apraxia or anomia. Speech is clear with normal prosody and enunciation. Thought process is linear. Mood is normal and affect is normal.  Cranial nerves II - XII are as described above under HEENT exam.  Motor exam: Normal bulk, strength and tone is noted. There is no obvious action or resting tremor.  Fine motor skills and coordination: grossly intact.  Cerebellar testing: No dysmetria or intention tremor. There is no truncal or gait ataxia.  Sensory exam: intact to light touch in the upper and lower extremities.  Gait, station and balance: She stands easily. No veering to one side is noted. No leaning to one side is noted. Posture is age-appropriate and stance is narrow based. Gait shows normal stride length and normal pace. No problems turning are noted.   Assessment and Plan:   In summary, Golie Simoni is a very pleasant 67 y.o.-year old female with an underlying medical history of asthma, anemia, eczema, allergies, hyperlipidemia, vitamin D deficiency, and obesity, whose history and physical exam are concerning for sleep disordered breathing, in particular, obstructive sleep apnea (OSA). A laboratory attended sleep study is typically considered "gold standard" for evaluation of sleep disordered breathing.   I had a long chat with the patient about my findings and the diagnosis of sleep apnea, particularly OSA, its prognosis and treatment options. We talked about medical/conservative treatments, surgical interventions and non-pharmacological approaches for symptom control. I explained, in particular, the risks and ramifications of untreated moderate to severe OSA, especially with respect to developing  cardiovascular disease down the road, including congestive heart failure (CHF), difficult to treat hypertension, cardiac arrhythmias (particularly A-fib), neurovascular complications including TIA, stroke and dementia. Even type 2 diabetes has, in part, been linked to untreated OSA. Symptoms of untreated OSA may include (but may not be limited to) daytime sleepiness, nocturia (  i.e. frequent nighttime urination), memory problems, mood irritability and suboptimally controlled or worsening mood disorder such as depression and/or anxiety, lack of energy, lack of motivation, physical discomfort, as well as recurrent headaches, especially morning or nocturnal headaches. We talked about the importance of maintaining a healthy lifestyle and striving for healthy weight.   I recommended a sleep study at this time. I outlined the differences between a laboratory attended sleep study which is considered more comprehensive and accurate over the option of a home sleep test (HST); the latter may lead to underestimation of sleep disordered breathing in some instances and does not help with diagnosing upper airway resistance syndrome and is not accurate enough to diagnose primary central sleep apnea typically. I outlined possible surgical and non-surgical treatment options of OSA, including the use of a positive airway pressure (PAP) device (i.e. CPAP, AutoPAP/APAP or BiPAP in certain circumstances), a custom-made dental device (aka oral appliance, which would require a referral to a specialist dentist or orthodontist typically, and is generally speaking not considered for patients with full dentures or edentulous state), upper airway surgical options, such as traditional UPPP (which is not considered a first-line treatment) or the Inspire device (hypoglossal nerve stimulator, which would involve a referral for consultation with an ENT surgeon, after careful selection, following inclusion criteria - also not first-line treatment).  I explained the PAP treatment option to the patient in detail, as this is generally considered first-line treatment.  The patient indicated that she would be willing to try PAP therapy, if the need arises. I explained the importance of being compliant with PAP treatment, not only for insurance purposes but primarily to improve patient's symptoms symptoms, and for the patient's long term health benefit, including to reduce Her cardiovascular risks longer-term.    We will pick up our discussion about the next steps and treatment options after testing.  We will keep her posted as to the test results by phone call and/or MyChart messaging where possible.  We will plan to follow-up in sleep clinic accordingly as well.  I answered all her questions today and the patient was in agreement.   I encouraged her to call with any interim questions, concerns, problems or updates or email [place] through [facility].  Generally speaking, sleep test authorizations may take up to 2 weeks, sometimes less, sometimes longer, the patient is encouraged to get in touch with [place] if they do not hear back from the sleep lab staff directly within the next 2 weeks.  Thank you very much for allowing me to participate in the care of this nice patient. If I can be of any further assistance to you please do not hesitate to call me at [phone].  Sincerely,   Star Age, MD, PhD

## 2022-08-10 NOTE — Patient Instructions (Signed)

## 2022-08-31 ENCOUNTER — Telehealth: Payer: Self-pay | Admitting: Neurology

## 2022-08-31 NOTE — Telephone Encounter (Signed)
[employer] NPSG pending faxed notes.

## 2022-09-06 ENCOUNTER — Encounter: Payer: Self-pay | Admitting: Neurology

## 2022-09-07 NOTE — Telephone Encounter (Signed)
[employer] denied the NPSG.  HST- [employer] no auth req.  Patient is scheduled for a Mail out on 09/09/22, sent patient a mychart message.

## 2022-09-09 ENCOUNTER — Ambulatory Visit: Payer: 59 | Admitting: Neurology

## 2022-09-09 DIAGNOSIS — R0681 Apnea, not elsewhere classified: Secondary | ICD-10-CM

## 2022-09-09 DIAGNOSIS — R030 Elevated blood-pressure reading, without diagnosis of hypertension: Secondary | ICD-10-CM

## 2022-09-09 DIAGNOSIS — G4733 Obstructive sleep apnea (adult) (pediatric): Secondary | ICD-10-CM | POA: Diagnosis not present

## 2022-09-09 DIAGNOSIS — R0683 Snoring: Secondary | ICD-10-CM

## 2022-09-09 DIAGNOSIS — Z9189 Other specified personal risk factors, not elsewhere classified: Secondary | ICD-10-CM

## 2022-09-09 DIAGNOSIS — E669 Obesity, unspecified: Secondary | ICD-10-CM

## 2022-09-17 NOTE — Addendum Note (Signed)
Addended by: Star Age on: 09/17/2022 03:50 PM   Modules accepted: Orders

## 2022-09-17 NOTE — Procedures (Signed)
[facility]  HOME SLEEP TEST (Watch PAT) REPORT - Mail-out Device  STUDY DATE: 09/13/2022  DOB: Nov 22, 1955  MRN: [id number]  ORDERING CLINICIAN: Star Age, MD, PhD   REFERRING CLINICIAN: Glendale Chard, MD   CLINICAL INFORMATION/HISTORY: 67 year old female with an underlying medical history of asthma, anemia, eczema, allergies, hyperlipidemia, vitamin D deficiency, and mild obesity, who reports snoring, difficulty maintaining sleep as well as witnessed apneas per husband's report.    Epworth sleepiness score: 3/24.  BMI: 34.5 kg/m  FINDINGS:   Sleep Summary:   Total Recording Time (hours, min): 8 hours, 54 min  Total Sleep Time (hours, min):  8 hours, 28 min  Percent REM (%):    20.7%   Respiratory Indices:   Calculated pAHI (per hour):  30.8/hour         REM pAHI:    48.7/hour       NREM pAHI: 26.1/hour  Central pAHI: 1.7/hour  Oxygen Saturation Statistics:    Oxygen Saturation (%) Mean: 92%   Minimum oxygen saturation (%):                 77%   O2 Saturation Range (%): 77 - 98%    O2 Saturation (minutes) <=88%: 12.8 min  Pulse Rate Statistics:   Pulse Mean (bpm):    58/min    Pulse Range (41 - 88/min)   IMPRESSION: OSA (obstructive sleep apnea)   RECOMMENDATION:  This home sleep test demonstrates severe obstructive sleep apnea with a total AHI of 30.8/hour and O2 nadir of 77%.  Snoring was detected, in the mild to moderate range, at times louder.  Treatment with positive airway pressure is highly recommended. This will require - ideally - a full night CPAP titration study for proper treatment settings, O2 monitoring and mask fitting. For now, the patient will be advised to proceed with an autoPAP titration/trial at home. A laboratory attended titration study can be considered in the future for optimization of treatment settings and to improve tolerance and compliance. Alternative treatment options are limited secondary to the  severity of the patient's sleep disordered breathing, but may include surgical treatment with an implantable hypoglossal nerve stimulator (in carefully selected candidates, meeting criteria).  Concomitant weight loss is recommended, where clinically appropriate. Please note, that untreated obstructive sleep apnea may carry additional perioperative morbidity. Patients with significant obstructive sleep apnea should receive perioperative PAP therapy and the surgeons and particularly the anesthesiologist should be informed of the diagnosis and the severity of the sleep disordered breathing. The patient should be cautioned not to drive, work at heights, or operate dangerous or heavy equipment when tired or sleepy. Review and reiteration of good sleep hygiene measures should be pursued with any patient. Other causes of the patient's symptoms, including circadian rhythm disturbances, an underlying mood disorder, medication effect and/or an underlying medical problem cannot be ruled out based on this test. Clinical correlation is recommended.  The patient and her referring provider will be notified of the test results. The patient will be seen in follow up in sleep clinic at [facility].  I certify that I have reviewed the raw data recording prior to the issuance of this report in accordance with the standards of the American Academy of Sleep Medicine (AASM).  INTERPRETING PHYSICIAN:   Star Age, MD, PhD Medical Director, [facility]) [place], ABPN (Neurology and Sleep)   [facility] [address], [place], [place] [address] [phone]

## 2022-09-22 ENCOUNTER — Telehealth: Payer: Self-pay | Admitting: *Deleted

## 2022-09-22 NOTE — Telephone Encounter (Signed)
-----   Message from Star Age, MD sent at 09/17/2022  3:50 PM EDT ----- Patient referred by Dr. Baird Cancer, seen by me on 08/10/2022, patient had a HST on 09/13/2022.    Please call and notify the patient that the recent home sleep test showed obstructive sleep apnea in the severe range. I recommend treatment for this in the form of autoPAP, which means, that we don't have to bring her in for a sleep study with CPAP, but will let her start using a so called autoPAP machine at home, through a DME company (of her choice, or as per insurance requirement). The DME representative will fit the patient with a mask of choice, educate her on how to use the machine, how to put the mask on, etc. I have placed an order in the chart. Please send the order to a local DME, talk to patient, send report to referring MD. Please also reinforce the need for compliance with treatment. We will need a FU in sleep clinic for 10 weeks post-PAP set up, please arrange that with me or one of our NPs. Thanks,   Star Age, MD, PhD [facility])

## 2022-09-22 NOTE — Telephone Encounter (Signed)
Called pt & discussed sleep study results as noted below by Dr Rexene Alberts.  The patient.  She was amenable to proceed with therapy.  We discussed her questions and she does not have a preference for a particular DME company. We will refer to [facility]. She will watch for a call from them within 1 week.  Patient understands insurance compliance requirements which includes using the machine for at least 4 hours at night and also being seen in our office for an initial follow-up between 30 and 90 days after set up. She was setup for a follow-up appt on Tues 12/08/22 at 9:30 AM arrival 9. The patient verbalized appreciation for the call.  Referral faxed to [facility]. Results sent to PCP (referring provider). Received a receipt of confirmation.

## 2022-09-22 NOTE — Telephone Encounter (Signed)
Called pt w/results

## 2022-10-24 ENCOUNTER — Other Ambulatory Visit: Payer: Self-pay | Admitting: Internal Medicine

## 2022-11-14 ENCOUNTER — Other Ambulatory Visit: Payer: Self-pay | Admitting: Internal Medicine

## 2022-11-17 ENCOUNTER — Other Ambulatory Visit: Payer: 59

## 2022-11-17 DIAGNOSIS — E7800 Pure hypercholesterolemia, unspecified: Secondary | ICD-10-CM

## 2022-11-19 ENCOUNTER — Ambulatory Visit: Payer: 59 | Admitting: Internal Medicine

## 2022-11-19 ENCOUNTER — Encounter: Payer: Self-pay | Admitting: Internal Medicine

## 2022-11-19 VITALS — BP 138/92 | HR 84 | Temp 98.6°F | Ht 62.0 in | Wt 187.6 lb

## 2022-11-19 DIAGNOSIS — E6609 Other obesity due to excess calories: Secondary | ICD-10-CM

## 2022-11-19 DIAGNOSIS — M810 Age-related osteoporosis without current pathological fracture: Secondary | ICD-10-CM | POA: Diagnosis not present

## 2022-11-19 DIAGNOSIS — E7800 Pure hypercholesterolemia, unspecified: Secondary | ICD-10-CM | POA: Diagnosis not present

## 2022-11-19 DIAGNOSIS — E559 Vitamin D deficiency, unspecified: Secondary | ICD-10-CM

## 2022-11-19 DIAGNOSIS — G4733 Obstructive sleep apnea (adult) (pediatric): Secondary | ICD-10-CM

## 2022-11-19 DIAGNOSIS — Z6834 Body mass index (BMI) 34.0-34.9, adult: Secondary | ICD-10-CM

## 2022-11-19 MED ORDER — AMLODIPINE BESYLATE 2.5 MG PO TABS: 2.5000 mg | ORAL_TABLET | Freq: Every day | ORAL | 11 refills | Status: DC

## 2022-11-19 NOTE — Patient Instructions (Signed)
Cholesterol Content in Foods ?Cholesterol is a waxy, fat-like substance that helps to carry fat in the blood. The body needs cholesterol in small amounts, but too much cholesterol can cause damage to the arteries and heart. ?What foods have cholesterol? ? ?Cholesterol is found in animal-based foods, such as meat, seafood, and dairy. Generally, low-fat dairy and lean meats have less cholesterol than full-fat dairy and fatty meats. The milligrams of cholesterol per serving (mg per serving) of common cholesterol-containing foods are listed below. ?Meats and other proteins ?Egg -- one large whole egg has 186 mg. ?Veal shank -- 4 oz (113 g) has 141 mg. ?Lean ground turkey (93% lean) -- 4 oz (113 g) has 118 mg. ?Fat-trimmed lamb loin -- 4 oz (113 g) has 106 mg. ?Lean ground beef (90% lean) -- 4 oz (113 g) has 100 mg. ?Lobster -- 3.5 oz (99 g) has 90 mg. ?Pork loin chops -- 4 oz (113 g) has 86 mg. ?Canned salmon -- 3.5 oz (99 g) has 83 mg. ?Fat-trimmed beef top loin -- 4 oz (113 g) has 78 mg. ?Frankfurter -- 1 frank (3.5 oz or 99 g) has 77 mg. ?Crab -- 3.5 oz (99 g) has 71 mg. ?Roasted chicken without skin, white meat -- 4 oz (113 g) has 66 mg. ?Light bologna -- 2 oz (57 g) has 45 mg. ?Deli-cut turkey -- 2 oz (57 g) has 31 mg. ?Canned tuna -- 3.5 oz (99 g) has 31 mg. ?Bacon -- 1 oz (28 g) has 29 mg. ?Oysters and mussels (raw) -- 3.5 oz (99 g) has 25 mg. ?Mackerel -- 1 oz (28 g) has 22 mg. ?Trout -- 1 oz (28 g) has 20 mg. ?Pork sausage -- 1 link (1 oz or 28 g) has 17 mg. ?Salmon -- 1 oz (28 g) has 16 mg. ?Tilapia -- 1 oz (28 g) has 14 mg. ?Dairy ?Soft-serve ice cream -- ? cup (4 oz or 86 g) has 103 mg. ?Whole-milk yogurt -- 1 cup (8 oz or 245 g) has 29 mg. ?Cheddar cheese -- 1 oz (28 g) has 28 mg. ?American cheese -- 1 oz (28 g) has 28 mg. ?Whole milk -- 1 cup (8 oz or 250 mL) has 23 mg. ?2% milk -- 1 cup (8 oz or 250 mL) has 18 mg. ?Cream cheese -- 1 tablespoon (Tbsp) (14.5 g) has 15 mg. ?Cottage cheese -- ? cup (4 oz or  113 g) has 14 mg. ?Low-fat (1%) milk -- 1 cup (8 oz or 250 mL) has 10 mg. ?Sour cream -- 1 Tbsp (12 g) has 8.5 mg. ?Low-fat yogurt -- 1 cup (8 oz or 245 g) has 8 mg. ?Nonfat Greek yogurt -- 1 cup (8 oz or 228 g) has 7 mg. ?Half-and-half cream -- 1 Tbsp (15 mL) has 5 mg. ?Fats and oils ?Cod liver oil -- 1 tablespoon (Tbsp) (13.6 g) has 82 mg. ?Butter -- 1 Tbsp (14 g) has 15 mg. ?Lard -- 1 Tbsp (12.8 g) has 14 mg. ?Bacon grease -- 1 Tbsp (12.9 g) has 14 mg. ?Mayonnaise -- 1 Tbsp (13.8 g) has 5-10 mg. ?Margarine -- 1 Tbsp (14 g) has 3-10 mg. ?The items listed above may not be a complete list of foods with cholesterol. Exact amounts of cholesterol in these foods may vary depending on specific ingredients and brands. Contact a dietitian for more information. ?What foods do not have cholesterol? ?Most plant-based foods do not have cholesterol unless you combine them with a food that has   cholesterol. Foods without cholesterol include: ?Grains and cereals. ?Vegetables. ?Fruits. ?Vegetable oils, such as olive, canola, and sunflower oil. ?Legumes, such as peas, beans, and lentils. ?Nuts and seeds. ?Egg whites. ?The items listed above may not be a complete list of foods that do not have cholesterol. Contact a dietitian for more information. ?Summary ?The body needs cholesterol in small amounts, but too much cholesterol can cause damage to the arteries and heart. ?Cholesterol is found in animal-based foods, such as meat, seafood, and dairy. Generally, low-fat dairy and lean meats have less cholesterol than full-fat dairy and fatty meats. ?This information is not intended to replace advice given to you by your health care provider. Make sure you discuss any questions you have with your health care provider. ?Document Revised: 10/25/2020 Document Reviewed: 10/25/2020 ?Elsevier Patient Education ? 2023 [employer] ? ?

## 2022-11-19 NOTE — Progress Notes (Signed)
I,Maria Kline,acting as a scribe for Maria Aliment, MD.,have documented all relevant documentation on the behalf of Maria Aliment, MD,as directed by  Maria Aliment, MD while in the presence of Maria Aliment, MD.    Subjective:     Patient ID: Maria Kline , female    DOB: 11-30-1955 , 67 y.o.   MRN: [id number]   Chief Complaint  Patient presents with   Hyperlipidemia    HPI  Patient presents today for cholesterol check. She reports compliance with medication. She did complete lipid lab on 5/21. She would like to discuss results today.   She reports visiting [facility], she admits the specialist is wonderful. She is now on CPAP therapy.       Past Medical History:  Diagnosis Date   Anemia    59yrs ago   Asthma    Cataracts, bilateral    Chicken pox    Complication of anesthesia 2000   c-section; excessive shivering   Eczema    hx of   Hx of seasonal allergies    Hyperlipidemia    Measles    Mumps    Vitamin D deficiency    Vit D on tues and fri     Family History  Problem Relation Age of Onset   Hypotension Mother    Hypertension Mother    Diabetes Mother    Dementia Mother    GI problems Mother    Colon cancer Father    Cancer - Colon Father    Hypertension Sister    Hypertension Paternal Grandmother    Hypertension Paternal Grandfather    Diabetes Other    Stroke Other    Hypertension Other    Heart disease Other    Healthy Son    Anesthesia problems Neg Hx    Malignant hyperthermia Neg Hx    Pseudochol deficiency Neg Hx      Current Outpatient Medications:    albuterol (VENTOLIN HFA) 108 (90 Base) MCG/ACT inhaler, TAKE 2 PUFFS BY MOUTH EVERY 6 HOURS AS NEEDED FOR WHEEZE OR SHORTNESS OF BREATH, Disp: 18 each, Rfl: 5   amLODipine (NORVASC) 2.5 MG tablet, Take 1 tablet (2.5 mg total) by mouth daily., Disp: 30 tablet, Rfl: 11   atorvastatin (LIPITOR) 40 MG tablet, TAKE 1 TABLET BY MOUTH EVERY DAY (Patient not taking:  Reported on 12/03/2022), Disp: 90 tablet, Rfl: 2   cetirizine (ZYRTEC) 10 MG tablet, Take 10 mg by mouth as needed., Disp: , Rfl:    Cholecalciferol (VITAMIN D3) 125 MCG (5000 UT) CAPS, Take by mouth., Disp: , Rfl:    HYDROcodone bit-homatropine (HYDROMET) 5-1.5 MG/5ML syrup, Take 5 mLs by mouth every 6 (six) hours as needed for cough. (Patient not taking: Reported on 12/03/2022), Disp: 120 mL, Rfl: 0   MAGNESIUM PO, Take 25 mg by mouth daily at 12 noon., Disp: , Rfl:    Mometasone Furoate (ASMANEX HFA) 100 MCG/ACT AERO, Inhale into the lungs as needed., Disp: , Rfl:    montelukast (SINGULAIR) 10 MG tablet, TAKE 1 TABLET BY MOUTH EVERY DAY (Patient taking differently: Take 10 mg by mouth as needed.), Disp: 90 tablet, Rfl: 2   LUMIGAN 0.01 % SOLN, Place 1 drop into both eyes at bedtime., Disp: , Rfl:    Allergies  Allergen Reactions   Demerol [Meperidine] Itching   Penicillins Other (See Comments)    Childhood reaction      Review of Systems  Constitutional: Negative.   Respiratory:  Negative.    Cardiovascular: Negative.   Neurological: Negative.   Psychiatric/Behavioral: Negative.       Today's Vitals   11/19/22 1017 11/19/22 1040  BP: (!) 140/100 (!) 138/92  Pulse: 84   Temp: 98.6 F (37 C)   SpO2: 98%   Weight: 187 lb 9.6 oz (85.1 kg)   Height: 5\' 2"  (1.575 m)    Body mass index is 34.31 kg/m.  Wt Readings from Last 3 Encounters:  12/03/22 187 lb 12.8 oz (85.2 kg)  11/19/22 187 lb 9.6 oz (85.1 kg)  08/10/22 187 lb (84.8 kg)   BP Readings from Last 3 Encounters:  12/03/22 (!) 161/82  11/19/22 (!) 138/92  08/10/22 (!) 158/91    The 10-year ASCVD risk score (Arnett DK, et al., 2019) is: 10.5%   Values used to calculate the score:     Age: 57 years     Sex: Female     Is Non-Hispanic African American: Yes     Diabetic: No     Tobacco smoker: No     Systolic Blood Pressure: 161 mmHg     Is BP treated: Yes     HDL Cholesterol: 63 mg/dL     Total Cholesterol: 140  mg/dL ++ Objective:  Physical Exam Vitals and nursing note reviewed.  Constitutional:      Appearance: Normal appearance. She is obese.  HENT:     Head: Normocephalic and atraumatic.  Eyes:     Extraocular Movements: Extraocular movements intact.  Cardiovascular:     Rate and Rhythm: Normal rate and regular rhythm.     Heart sounds: Normal heart sounds.  Pulmonary:     Effort: Pulmonary effort is normal.     Breath sounds: Normal breath sounds.  Musculoskeletal:     Cervical back: Normal range of motion.  Skin:    General: Skin is warm.  Neurological:     General: No focal deficit present.     Mental Status: She is alert.  Psychiatric:        Mood and Affect: Mood normal.        Behavior: Behavior normal.         Assessment And Plan:     1. Pure hypercholesterolemia Comments: We discussed use of coronary calcium scoring. She agrees to move forward with this testing.  She is aware of the $99 fee. - CT CARDIAC SCORING (SELF PAY ONLY); Future  2. Osteoporosis of lumbar spine Comments: Chronic, has upcoming appt with Rheumatology. Encouraged to engage in weight-bearing exercises, c/w calcium/vitamin D supplementation.  3. OSA on CPAP Comments: Chronic, encouraged to wear CPAP at least four hours per night.  4. Class 1 obesity due to excess calories with serious comorbidity and body mass index (BMI) of 34.0 to 34.9 in adult She is encouraged to strive for BMI less than 30 to decrease cardiac risk. Advised to aim for at least 150 minutes of exercise per week.   Return in 2 weeks (on 12/03/2022), or NV-bp check.  Patient was given opportunity to ask questions. Patient verbalized understanding of the plan and was able to repeat key elements of the plan. All questions were answered to their satisfaction.   I, Maria Aliment, MD, have reviewed all documentation for this visit. The documentation on 11/19/22 for the exam, diagnosis, procedures, and orders are all accurate and  complete.   IF YOU HAVE BEEN REFERRED TO A SPECIALIST, IT MAY TAKE 1-2 WEEKS TO SCHEDULE/PROCESS THE REFERRAL. IF YOU HAVE  NOT HEARD FROM US/SPECIALIST IN TWO WEEKS, PLEASE GIVE [place] A CALL AT [phone].   THE PATIENT IS ENCOURAGED TO PRACTICE SOCIAL DISTANCING DUE TO THE COVID-19 PANDEMIC.

## 2022-11-19 NOTE — Progress Notes (Signed)
Office Visit Note  Patient: Maria Kline             Date of Birth: 1955-08-31           MRN: [id number]             PCP: Dorothyann Peng, MD Referring: Dorothyann Peng, MD Visit Date: 12/03/2022 Occupation: @GUAROCC @  Subjective:  Osteoporosis  History of Present Illness: Maria Kline is a 67 y.o. female seen in consultation per request of Dr. Allyne Gee for the evaluation of osteoporosis.  Patient states that she was diagnosed with osteopenia in 2021.  She was recently diagnosed with osteoporosis.  She had menarche at age 53 and menopause at age 67.  She does not recall taking any medications for thyroid disease, antiepileptics, antacids or long-term prednisone.  There is no history of a stress fracture.  She has been taking calcium rich diet and vitamin D 5000 units daily.  There is history of vitamin D deficiency.  She was exercising routinely in the past but has not exercised in the last 4 months.  She is planning to resume walking and water aerobics.  She denies history of height loss.  She was recently found to have elevated liver functions.  She was taken off the statins by Dr. Allyne Gee.  She will have repeat LFTs.  She used to drink alcohol only socially.  She was from coffee which she recently stopped.  She has been off NSAIDs for the last 1 year.  She is gravida 1, para 1.  There is questionable family history of osteoporosis in her mother as she lost her height.  There is no history of fracture in her mother.    Activities of Daily Living:  Patient reports morning stiffness for 1-2 minutes.   Patient Denies nocturnal pain.  Difficulty dressing/grooming: Denies Difficulty climbing stairs: Denies Difficulty getting out of chair: Denies Difficulty using hands for taps, buttons, cutlery, and/or writing: Denies  Review of Systems  Constitutional:  Negative for fatigue.  HENT:  Positive for mouth dryness. Negative for mouth sores.   Eyes:  Negative for dryness.   Respiratory:  Negative for shortness of breath.   Cardiovascular:  Negative for chest pain and palpitations.  Gastrointestinal:  Negative for blood in stool, constipation and diarrhea.  Endocrine: Negative for increased urination.  Genitourinary:  Negative for involuntary urination.  Musculoskeletal:  Positive for joint pain, joint pain and morning stiffness. Negative for gait problem, joint swelling, myalgias, muscle weakness, muscle tenderness and myalgias.  Skin:  Positive for sensitivity to sunlight. Negative for color change, rash and hair loss.  Allergic/Immunologic: Negative for susceptible to infections.  Neurological:  Negative for dizziness and headaches.  Hematological:  Negative for swollen glands.  Psychiatric/Behavioral:  Negative for depressed mood and sleep disturbance. The patient is not nervous/anxious.     PMFS History:  Patient Active Problem List   Diagnosis Date Noted   Acute bronchitis due to other specified organisms 03/09/2022   Postnasal drip 03/09/2022   Pure hypercholesterolemia 05/13/2021   Elevated blood pressure reading 05/13/2021   Class 1 obesity due to excess calories without serious comorbidity with body mass index (BMI) of 33.0 to 33.9 in adult 05/13/2021   Perimenopause 09/07/2011   Hair thinning 09/07/2011    Past Medical History:  Diagnosis Date   Anemia    90yrs ago   Asthma    Cataracts, bilateral    Chicken pox    Complication of anesthesia 2000  c-section; excessive shivering   Eczema    hx of   Hx of seasonal allergies    Hyperlipidemia    Measles    Mumps    Vitamin D deficiency    Vit D on tues and fri    Family History  Problem Relation Age of Onset   Hypotension Mother    Hypertension Mother    Diabetes Mother    Dementia Mother    GI problems Mother    Colon cancer Father    Cancer - Colon Father    Hypertension Sister    Hypertension Paternal Grandmother    Hypertension Paternal Grandfather    Diabetes Other     Stroke Other    Hypertension Other    Heart disease Other    Healthy Son    Anesthesia problems Neg Hx    Malignant hyperthermia Neg Hx    Pseudochol deficiency Neg Hx    Past Surgical History:  Procedure Laterality Date   adenodectomy     CAPSULOTOMY  03/10/2012   Procedure: MINOR CAPSULOTOMY;  Surgeon: Vita Erm., MD;  Location: [facility] OR;  Service: Ophthalmology;  Laterality: Left;  Left Eye   CATARACT EXTRACTION W/PHACO  11/25/2011   Procedure: CATARACT EXTRACTION PHACO AND INTRAOCULAR LENS PLACEMENT (IOC);  Surgeon: Shade Flood, MD;  Location: [facility] OR;  Service: Ophthalmology;  Laterality: Left;   CATARACT EXTRACTION W/PHACO  12/07/2011   Procedure: CATARACT EXTRACTION PHACO AND INTRAOCULAR LENS PLACEMENT (IOC);  Surgeon: Shade Flood, MD;  Location: [facility] OR;  Service: Ophthalmology;  Laterality: Right;   cataract surgery      left   CESAREAN SECTION  2000   COLONOSCOPY     MYOMECTOMY  1996   REFRACTIVE SURGERY Bilateral 02/2018   for glaucoma, performed by Dr. Harlon Flor   TONSILLECTOMY AND ADENOIDECTOMY  1964   as a child   WISDOM TOOTH EXTRACTION  1970   Social History   Social History Narrative   Caffiene; 12 oz every morning.     Working: Teaching laboratory technician   Married with one adult child (living in [place])   Husband as CPAP   Immunization History  Administered Date(s) Administered   DTaP 09/17/1994   Hepatitis A 09/17/1994, 10/28/2009   IPV 09/17/1994   Influenza,inj,Quad PF,6+ Mos 04/12/2018, 04/18/2019, 04/10/2021   Influenza-Unspecified 04/05/2020, 03/28/2022   PFIZER(Purple Top)SARS-COV-2 Vaccination 09/02/2019, 09/23/2019, 04/05/2020   Pfizer Covid-19 Vaccine Bivalent Booster 75yrs & up 11/08/2021, 03/28/2022   Pneumococcal Conjugate-13 11/28/2013   Pneumococcal Polysaccharide-23 05/01/2020   Td 10/03/2009   Tdap 09/26/2009, 10/03/2009, 12/06/2019   Typhoid Inactivated 09/17/1994, 10/28/2009   Zoster Recombinat (Shingrix) 05/18/2021, 09/03/2021      Objective: Vital Signs: BP (!) 161/82 (BP Location: Right Arm, Patient Position: Sitting, Cuff Size: Normal)   Pulse 67   Resp 14   Ht 5\' 3"  (1.6 m)   Wt 187 lb 12.8 oz (85.2 kg)   LMP 09/20/2010   BMI 33.27 kg/m    Physical Exam Vitals and nursing note reviewed.  Constitutional:      Appearance: She is well-developed.  HENT:     Head: Normocephalic and atraumatic.  Eyes:     Conjunctiva/sclera: Conjunctivae normal.  Cardiovascular:     Rate and Rhythm: Normal rate and regular rhythm.     Heart sounds: Normal heart sounds.  Pulmonary:     Effort: Pulmonary effort is normal.     Breath sounds: Normal breath sounds.  Abdominal:     General: Bowel sounds  are normal.     Palpations: Abdomen is soft.  Musculoskeletal:     Cervical back: Normal range of motion.  Lymphadenopathy:     Cervical: No cervical adenopathy.  Skin:    General: Skin is warm and dry.     Capillary Refill: Capillary refill takes less than 2 seconds.  Neurological:     Mental Status: She is alert and oriented to person, place, and time.  Psychiatric:        Behavior: Behavior normal.      Musculoskeletal Exam: Cervical, thoracic and lumbar spine were in good range of motion.  Shoulder joints, elbow joints, wrist joints, MCPs PIPs and DIPs were in good range of motion.  She had bilateral mild PIP and DIP thickening with no synovitis.  Hip joints and knee joints were in good range of motion without any warmth swelling or effusion.  There was no tenderness over ankles or MTPs.  Bilateral bunions were noted.  CDAI Exam: CDAI Score: -- Patient Global: --; Provider Global: -- Swollen: --; Tender: -- Joint Exam 12/03/2022   No joint exam has been documented for this visit   There is currently no information documented on the homunculus. Go to the Rheumatology activity and complete the homunculus joint exam.  Investigation: No additional findings.  Imaging: No results found.  Recent Labs: Lab  Results  Component Value Date   WBC 3.7 05/19/2022   HGB 13.7 05/19/2022   PLT 244 05/19/2022   NA 140 11/17/2022   K 4.5 11/17/2022   CL 103 11/17/2022   CO2 23 11/17/2022   GLUCOSE 85 11/17/2022   BUN 11 11/17/2022   CREATININE 0.71 11/17/2022   BILITOT 0.4 11/17/2022   ALKPHOS 121 11/17/2022   AST 91 (H) 11/17/2022   ALT 161 (H) 11/17/2022   ALT 159 (H) 11/17/2022   PROT 6.6 11/17/2022   ALBUMIN 3.9 11/17/2022   CALCIUM 9.2 11/17/2022   GFRAA 104 04/25/2020    Speciality Comments: No specialty comments available.  Procedures:  No procedures performed Allergies: Demerol [meperidine] and Penicillins   Assessment / Plan:     Visit Diagnoses: Age-related osteoporosis without current pathological fracture - June 11, 2021 T-score -2.5, BMD 0.880 AP spine with 0.8% change, left total hip BMD -1.0 BMD 0.867 with 2.6% change left femoral neck T-score -1.3 BMD 0.771 change -0.8%.  Patient states that she was diagnosed with osteoporosis in December 2022.  She has been taking calcium on a regular basis and also vitamin D.  She has been exercising on a regular basis except for the last 4 months.  There was no significant change in her BMD when compared to 2021.  Detailed counsel regarding osteoporosis was provided.  Different treatment options and their side effects were discussed.  Patient needs dental implants and does not want to take any medications which will have risk for osteonecrosis of the jaw.  We discussed side effects of bisphosphonates, Prolia, Evenity and anabolic agents.  Due to insurance restrictions we can only try bisphosphonates or Prolia first.  Both classes have increased risk of osteonecrosis of the jaw.  Patient would like to hold off treatment until she does dental work.  I was in agreement.  She was advised to continue to take calcium and vitamin D will resume resistive exercises.  I also encouraged her to try a house to strong.  I will obtain some labs which can be  drawn at Dr. Allyne Gee office next week.  A handout on osteoporosis  and Fosamax was provided.  I advised her to contact our office when she is done with the dental work so she can be started on Fosamax.  Vitamin D deficiency -vitamin D was 56.4 on May 26, 2022 which was in the desirable range.  She can get vitamin D levels checked again.  Vitamin D should be kept between 40-50 ideally.  Plan: VITAMIN D 25 Hydroxy (Vit-D Deficiency, Fractures)  Primary osteoarthritis of both hands-she had mild osteoarthritis in her hands which is not causing any discomfort.  Bilateral bunions-proper fitting shoes were advised.  Elevated alkaline phosphatase level-her alkaline phosphatase was elevated in May 2023 which has normalized now on Nov 17, 2022.  Elevated LFTs-AST and ALT had been elevated since April 2022.  Patient has avoided all NSAIDs.  She also came off of statins.  Her LFTs were higher in May 2024.  She recently stopped supplements.  She will have repeat LFTs with Dr. Allyne Gee next week.  She may need further evaluation.  Pure hypercholesterolemia-treated with Lipitor in the past.  She has been off Lipitor.  History of glaucoma  Class 1 obesity due to excess calories without serious comorbidity with body mass index (BMI) of 33.0 to 33.9 in adult  OSA (obstructive sleep apnea) - She is on CPAP.  Orders: Orders Placed This Encounter  Procedures   Parathyroid hormone, intact (no Ca)   VITAMIN D 25 Hydroxy (Vit-D Deficiency, Fractures)   Phosphorus   Serum protein electrophoresis with reflex   No orders of the defined types were placed in this encounter.  Face-to-face time spent with patient was 45 minutes.  Greater than 50% time was spent in counseling and coordination of care.  Follow-Up Instructions: Return in about 2 years (around 12/02/2024) for Osteoporosis.   Pollyann Savoy, MD  Note - This record has been created using Animal nutritionist.  Chart creation errors have been sought,  but may not always  have been located. Such creation errors do not reflect on  the standard of medical care.

## 2022-11-20 LAB — LIPID PANEL
Chol/HDL Ratio: 2.2 ratio (ref 0.0–4.4)
Cholesterol, Total: 140 mg/dL (ref 100–199)
HDL: 63 mg/dL (ref >39)
LDL Chol Calc (NIH): 62 mg/dL (ref 0–99)
Triglycerides: 79 mg/dL (ref 0–149)
VLDL Cholesterol Cal: 15 mg/dL (ref 5–40)

## 2022-11-24 ENCOUNTER — Ambulatory Visit: Payer: Self-pay | Admitting: Internal Medicine

## 2022-11-25 ENCOUNTER — Other Ambulatory Visit: Payer: Self-pay | Admitting: Internal Medicine

## 2022-11-25 DIAGNOSIS — R748 Abnormal levels of other serum enzymes: Secondary | ICD-10-CM

## 2022-11-25 LAB — SPECIMEN STATUS REPORT

## 2022-11-25 LAB — ALT: ALT: 161 IU/L — ABNORMAL HIGH (ref 0–32)

## 2022-11-26 ENCOUNTER — Other Ambulatory Visit: Payer: Self-pay | Admitting: Internal Medicine

## 2022-11-26 DIAGNOSIS — R748 Abnormal levels of other serum enzymes: Secondary | ICD-10-CM

## 2022-11-27 LAB — SPECIMEN STATUS REPORT

## 2022-11-27 LAB — COMPREHENSIVE METABOLIC PANEL
ALT: 159 IU/L — ABNORMAL HIGH (ref 0–32)
AST: 91 IU/L — ABNORMAL HIGH (ref 0–40)
Albumin/Globulin Ratio: 1.4 (ref 1.2–2.2)
Albumin: 3.9 g/dL (ref 3.9–4.9)
Alkaline Phosphatase: 121 IU/L (ref 44–121)
BUN/Creatinine Ratio: 15 (ref 12–28)
BUN: 11 mg/dL (ref 8–27)
Bilirubin Total: 0.4 mg/dL (ref 0.0–1.2)
CO2: 23 mmol/L (ref 20–29)
Calcium: 9.2 mg/dL (ref 8.7–10.3)
Chloride: 103 mmol/L (ref 96–106)
Creatinine, Ser: 0.71 mg/dL (ref 0.57–1.00)
Globulin, Total: 2.7 g/dL (ref 1.5–4.5)
Glucose: 85 mg/dL (ref 70–99)
Potassium: 4.5 mmol/L (ref 3.5–5.2)
Sodium: 140 mmol/L (ref 134–144)
Total Protein: 6.6 g/dL (ref 6.0–8.5)
eGFR: 95 mL/min/{1.73_m2} (ref >59)

## 2022-12-03 ENCOUNTER — Encounter: Payer: Self-pay | Admitting: Rheumatology

## 2022-12-03 ENCOUNTER — Ambulatory Visit: Payer: Medicare HMO | Attending: Rheumatology | Admitting: Rheumatology

## 2022-12-03 VITALS — BP 161/82 | HR 67 | Resp 14 | Ht 63.0 in | Wt 187.8 lb

## 2022-12-03 DIAGNOSIS — R7989 Other specified abnormal findings of blood chemistry: Secondary | ICD-10-CM

## 2022-12-03 DIAGNOSIS — G4733 Obstructive sleep apnea (adult) (pediatric): Secondary | ICD-10-CM

## 2022-12-03 DIAGNOSIS — E559 Vitamin D deficiency, unspecified: Secondary | ICD-10-CM

## 2022-12-03 DIAGNOSIS — Z8669 Personal history of other diseases of the nervous system and sense organs: Secondary | ICD-10-CM | POA: Diagnosis not present

## 2022-12-03 DIAGNOSIS — M19042 Primary osteoarthritis, left hand: Secondary | ICD-10-CM

## 2022-12-03 DIAGNOSIS — R748 Abnormal levels of other serum enzymes: Secondary | ICD-10-CM | POA: Diagnosis not present

## 2022-12-03 DIAGNOSIS — M19041 Primary osteoarthritis, right hand: Secondary | ICD-10-CM

## 2022-12-03 DIAGNOSIS — M21612 Bunion of left foot: Secondary | ICD-10-CM

## 2022-12-03 DIAGNOSIS — E7800 Pure hypercholesterolemia, unspecified: Secondary | ICD-10-CM | POA: Diagnosis not present

## 2022-12-03 DIAGNOSIS — M21611 Bunion of right foot: Secondary | ICD-10-CM

## 2022-12-03 DIAGNOSIS — M5451 Vertebrogenic low back pain: Secondary | ICD-10-CM | POA: Diagnosis not present

## 2022-12-03 DIAGNOSIS — Z6833 Body mass index (BMI) 33.0-33.9, adult: Secondary | ICD-10-CM

## 2022-12-03 DIAGNOSIS — M810 Age-related osteoporosis without current pathological fracture: Secondary | ICD-10-CM

## 2022-12-03 DIAGNOSIS — E6609 Other obesity due to excess calories: Secondary | ICD-10-CM | POA: Diagnosis not present

## 2022-12-03 NOTE — Patient Instructions (Addendum)
Please get following labs with your next lab draw: Intact PTH Vitamin D Phosphorus SPEP with reflex   Osteoporosis  Osteoporosis happens when the bones become thin and less dense than normal. Osteoporosis makes bones more brittle and fragile and more likely to break (fracture). Over time, osteoporosis can cause your bones to become so weak that they fracture after a minor fall. Bones in the hip, wrist, and spine are most likely to fracture due to osteoporosis. What are the causes? The exact cause of this condition is not known. What increases the risk? You are more likely to develop this condition if you: Have family members with this condition. Have poor nutrition. Use the following: Steroid medicines, such as prednisone. Anti-seizure medicines. Nicotine or tobacco, such as cigarettes, e-cigarettes, and chewing tobacco. Are female. Are age 14 or older. Are not physically active (are sedentary). Are of European or Asian descent. Have a small body frame. What are the signs or symptoms? A fracture might be the first sign of osteoporosis, especially if the fracture results from a fall or injury that usually would not cause a bone to break. Other signs and symptoms include: Pain in the neck or low back. Stooped posture. Loss of height. How is this diagnosed? This condition may be diagnosed based on: Your medical history. A physical exam. A bone mineral density test, also called a DXA or DEXA test (dual-energy X-ray absorptiometry test). This test uses X-rays to measure the amount of minerals in your bones. How is this treated? This condition may be treated by: Making lifestyle changes, such as: Including foods with more calcium and vitamin D in your diet. Doing weight-bearing and muscle-strengthening exercises. Stopping tobacco use. Limiting alcohol intake. Taking medicine to slow the process of bone loss or to increase bone density. Taking daily supplements of calcium and  vitamin D. Taking hormone replacement medicines, such as estrogen for women and testosterone for men. Monitoring your levels of calcium and vitamin D. The goal of treatment is to strengthen your bones and lower your risk for a fracture. Follow these instructions at home: Eating and drinking Include calcium and vitamin D in your diet. Calcium is important for bone health, and vitamin D helps your body absorb calcium. Good sources of calcium and vitamin D include: Certain fatty fish, such as salmon and tuna. Products that have calcium and vitamin D added to them (are fortified), such as fortified cereals. Egg yolks. Cheese. Liver.  Activity Do exercises as told by your health care provider. Ask your health care provider what exercises and activities are safe for you. You should do: Exercises that make you work against gravity (weight-bearing exercises), such as tai chi, yoga, or walking. Exercises to strengthen muscles, such as lifting weights. Lifestyle Do not drink alcohol if: Your health care provider tells you not to drink. You are pregnant, may be pregnant, or are planning to become pregnant. If you drink alcohol: Limit how much you use to: 0-1 drink a day for women. 0-2 drinks a day for men. Know how much alcohol is in your drink. In the [place] one drink equals one 12 oz bottle of beer (355 mL), one 5 oz glass of wine (148 mL), or one 1 oz glass of hard liquor (44 mL). Do not use any products that contain nicotine or tobacco, such as cigarettes, e-cigarettes, and chewing tobacco. If you need help quitting, ask your health care provider. Preventing falls Use devices to help you move around (mobility aids) as needed, such  as canes, walkers, scooters, or crutches. Keep rooms well-lit and clutter-free. Remove tripping hazards from walkways, including cords and throw rugs. Install grab bars in bathrooms and safety rails on stairs. Use rubber mats in the bathroom and other areas that  are often wet or slippery. Wear closed-toe shoes that fit well and support your feet. Wear shoes that have rubber soles or low heels. Review your medicines with your health care provider. Some medicines can cause dizziness or changes in blood pressure, which can increase your risk of falling. General instructions Take over-the-counter and prescription medicines only as told by your health care provider. Keep all follow-up visits. This is important. Contact a health care provider if: You have never been screened for osteoporosis and you are: A woman who is age 59 or older. A man who is age 43 or older. Get help right away if: You fall or injure yourself. Summary Osteoporosis is thinning and loss of density in your bones. This makes bones more brittle and fragile and more likely to break (fracture),even with minor falls. The goal of treatment is to strengthen your bones and lower your risk for a fracture. Include calcium and vitamin D in your diet. Calcium is important for bone health, and vitamin D helps your body absorb calcium. Talk with your health care provider about screening for osteoporosis if you are a woman who is age 19 or older, or a man who is age 31 or older. This information is not intended to replace advice given to you by your health care provider. Make sure you discuss any questions you have with your health care provider. Document Revised: 11/30/2019 Document Reviewed: 11/30/2019 Elsevier Patient Education  2024 Elsevier Inc.   Alendronate Tablets What is this medication? ALENDRONATE (a LEN droe nate) prevents and treats osteoporosis. It may also be used to treat Paget disease of the bone. It works by Interior and spatial designer stronger and less likely to break (fracture). It belongs to a group of medications called bisphosphonates. This medicine may be used for other purposes; ask your health care provider or pharmacist if you have questions. COMMON BRAND NAME(S): Fosamax What  should I tell my care team before I take this medication? They need to know if you have any of these conditions: Bleeding disorder Cancer Dental disease Difficulty swallowing Infection (fever, chills, cough, sore throat, pain or trouble passing urine) Kidney disease Low levels of calcium or other minerals in the blood Low red blood cell counts Receiving steroids like dexamethasone or prednisone Stomach or intestine problems Trouble sitting or standing for 30 minutes An unusual or allergic reaction to alendronate, other medications, foods, dyes or preservatives Pregnant or trying to get pregnant Breast-feeding How should I use this medication? Take this medication by mouth with a full glass of water. Take it as directed on the prescription label at the same time every day. Take the dose right after waking up. Do not eat or drink anything before taking it. Do not take it with any other drink except water. Do not chew or crush the tablet. After taking it, do not eat breakfast, drink, or take any other medications or vitamins for at least 30 minutes. Sit or stand up for at least 30 minutes after you take it. Do not lie down. Keep taking it unless your care team tells you to stop. A special MedGuide will be given to you by the pharmacist with each prescription and refill. Be sure to read this information carefully each time. Talk  to your care team about the use of this medication in children. Special care may be needed. Overdosage: If you think you have taken too much of this medicine contact a poison control center or emergency room at once. NOTE: This medicine is only for you. Do not share this medicine with others. What if I miss a dose? If you take your medication once a day, skip it. Take your next dose at the scheduled time the next morning. Do not take two doses on the same day. If you take your medication once a week, take the missed dose on the morning after you remember. Do not take two  doses on the same day. What may interact with this medication? Aluminum hydroxide Antacids Aspirin Calcium supplements Medications for inflammation like ibuprofen, naproxen, and others Iron supplements Magnesium supplements Vitamins with minerals This list may not describe all possible interactions. Give your health care provider a list of all the medicines, herbs, non-prescription drugs, or dietary supplements you use. Also tell them if you smoke, drink alcohol, or use illegal drugs. Some items may interact with your medicine. What should I watch for while using this medication? Visit your care team for regular checks on your progress. It may be some time before you see the benefit from this medication. Some people who take this medication have severe bone, joint, or muscle pain. This medication may also increase your risk for jaw problems or a broken thigh bone. Tell your care team right away if you have severe pain in your jaw, bones, joints, or muscles. Tell you care team if you have any pain that does not go away or that gets worse. Tell your dentist and dental surgeon that you are taking this medication. You should not have major dental surgery while on this medication. See your dentist to have a dental exam and fix any dental problems before starting this medication. Take good care of your teeth while on this medication. Make sure you see your dentist for regular follow-up appointments. You should make sure you get enough calcium and vitamin D while you are taking this medication. Discuss the foods you eat and the vitamins you take with your care team. You may need blood work done while you are taking this medication. What side effects may I notice from receiving this medication? Side effects that you should report to your care team as soon as possible: Allergic reactions--skin rash, itching, hives, swelling of the face, lips, tongue, or throat Low calcium level--muscle pain or cramps,  confusion, tingling, or numbness in the hands or feet Osteonecrosis of the jaw--pain, swelling, or redness in the mouth, numbness of the jaw, poor healing after dental work, unusual discharge from the mouth, visible bones in the mouth Pain or trouble swallowing Severe bone, joint, or muscle pain Stomach bleeding--bloody or black, tar-like stools, vomiting blood or brown material that looks like coffee grounds Side effects that usually do not require medical attention (report to your care team if they continue or are bothersome): Constipation Diarrhea Nausea Stomach pain This list may not describe all possible side effects. Call your doctor for medical advice about side effects. You may report side effects to FDA at 1-800-FDA-1088. Where should I keep my medication? Keep out of the reach of children and pets. Store at room temperature between 15 and 30 degrees C (59 and 86 degrees F). Throw away any unused medication after the expiration date. NOTE: This sheet is a summary. It may not cover all possible  information. If you have questions about this medicine, talk to your doctor, pharmacist, or health care provider.  2024 Elsevier/Gold Standard (2020-06-27 00:00:00)

## 2022-12-08 ENCOUNTER — Ambulatory Visit: Payer: Medicare HMO

## 2022-12-08 ENCOUNTER — Ambulatory Visit (INDEPENDENT_AMBULATORY_CARE_PROVIDER_SITE_OTHER): Payer: Medicare HMO | Admitting: Adult Health

## 2022-12-08 ENCOUNTER — Encounter: Payer: Self-pay | Admitting: Adult Health

## 2022-12-08 VITALS — BP 120/80 | HR 71 | Temp 98.5°F | Ht 63.0 in | Wt 186.0 lb

## 2022-12-08 VITALS — BP 123/79 | HR 75 | Ht 63.0 in | Wt 186.0 lb

## 2022-12-08 DIAGNOSIS — E559 Vitamin D deficiency, unspecified: Secondary | ICD-10-CM | POA: Diagnosis not present

## 2022-12-08 DIAGNOSIS — R748 Abnormal levels of other serum enzymes: Secondary | ICD-10-CM | POA: Diagnosis not present

## 2022-12-08 DIAGNOSIS — M810 Age-related osteoporosis without current pathological fracture: Secondary | ICD-10-CM | POA: Diagnosis not present

## 2022-12-08 DIAGNOSIS — R030 Elevated blood-pressure reading, without diagnosis of hypertension: Secondary | ICD-10-CM

## 2022-12-08 DIAGNOSIS — G4733 Obstructive sleep apnea (adult) (pediatric): Secondary | ICD-10-CM

## 2022-12-08 LAB — ALT: ALT: 59 IU/L — ABNORMAL HIGH (ref 0–32)

## 2022-12-08 NOTE — Progress Notes (Signed)
Patient presents today for a BP check, patient currently taking amLODipine 2.5mg  PM.  BP Readings from Last 3 Encounters:  12/08/22 120/80  12/08/22 123/79  12/03/22 (!) 161/82  Per provider- follow up in September with PCP! Everyday done mushroom coffee

## 2022-12-08 NOTE — Progress Notes (Signed)
PATIENT: Maria Kline DOB: February 10, 1956  REASON FOR VISIT: follow up HISTORY FROM: patient PRIMARY NEUROLOGIST: Dr. Frances Furbish  Chief Complaint  Patient presents with   Follow-up    Pt in 8  Pt here for CPAP f/u Pt states loves CPAP machine Pt states changed her life . More energy      HISTORY OF PRESENT ILLNESS: Today 12/08/22:  Maria Kline is a 67 y.o. female with a history of obstructive sleep apnea on CPAP. Returns today for follow-up.  She had sleep study that showed severe sleep apnea.  She was started on CPAP therapy.  She is here today for her initial download.  She reports that the CPAP has been working well for her.  She states that it has "changed her life."  Her download is below     HISTORY Ms. [name] is a 67 year old female with an underlying medical history of asthma, anemia, eczema, allergies, hyperlipidemia, vitamin D deficiency, and mild obesity, who reports snoring, difficulty maintaining sleep as well as witnessed apneas per husband's report.  Her Epworth sleepiness score is 3 out of 24, fatigue severity score is 17 out of 63.  I reviewed your office note from 05/26/2022. She is not aware of any family history of sleep apnea but is familiar with the diagnosis as her husband has a CPAP machine.  She would be willing to get tested and try PAP therapy.  She has trouble maintaining sleep, no trouble falling asleep typically.  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  She is phasing out her 6-year term.  She is a non-smoker and drinks alcohol infrequently, maybe once a month and caffeine in the form of coffee, about a 12 ounce cup per day.  Bedtime is around midnight and rise time around 8.  She has slowly gained weight over time.  She denies recurrent morning  headaches or night to night nocturia.  She had a tonsillectomy and adenoidectomy at about 67 years of age.  She has a history of allergies and postnasal drip and congestion, takes allergy medicine and has usually well-controlled asthma, has a rescue inhaler.  She occasionally takes melatonin at night or uses other over-the-counter measures for sleep including sleepy time tea or milk with turmeric.  She takes magnesium daily.    REVIEW OF SYSTEMS: Out of a complete 14 system review of symptoms, the patient complains only of the following symptoms, and all other reviewed systems are negative.   ALLERGIES: Allergies  Allergen Reactions   Demerol [Meperidine] Itching   Penicillins Other (See Comments)    Childhood reaction     HOME MEDICATIONS: Outpatient Medications Prior to Visit  Medication Sig Dispense Refill   albuterol (VENTOLIN HFA) 108 (90 Base) MCG/ACT inhaler TAKE 2 PUFFS BY MOUTH EVERY 6 HOURS AS NEEDED FOR WHEEZE OR SHORTNESS OF BREATH 18 each 5   amLODipine (NORVASC) 2.5 MG tablet Take 1 tablet (2.5 mg total) by mouth daily. 30 tablet 11   atorvastatin (LIPITOR) 40 MG tablet TAKE 1 TABLET BY MOUTH EVERY DAY 90 tablet 2   cetirizine (ZYRTEC) 10 MG tablet Take 10 mg by mouth as needed.     Cholecalciferol (VITAMIN D3) 125 MCG (5000 UT) CAPS Take by mouth.     HYDROcodone bit-homatropine (HYDROMET)  5-1.5 MG/5ML syrup Take 5 mLs by mouth every 6 (six) hours as needed for cough. 120 mL 0   LUMIGAN 0.01 % SOLN Place 1 drop into both eyes at bedtime.     MAGNESIUM PO Take 25 mg by mouth daily at 12 noon.     Mometasone Furoate (ASMANEX HFA) 100 MCG/ACT AERO Inhale into the lungs as needed.     montelukast (SINGULAIR) 10 MG tablet TAKE 1 TABLET BY MOUTH EVERY DAY (Patient taking differently: Take 10 mg by mouth as needed.) 90 tablet 2   No facility-administered medications prior to visit.    PAST MEDICAL HISTORY: Past Medical History:  Diagnosis Date   Anemia    31yrs ago    Asthma    Cataracts, bilateral    Chicken pox    Complication of anesthesia 2000   c-section; excessive shivering   Eczema    hx of   Hx of seasonal allergies    Hyperlipidemia    Measles    Mumps    Vitamin D deficiency    Vit D on tues and fri    PAST SURGICAL HISTORY: Past Surgical History:  Procedure Laterality Date   adenodectomy     CAPSULOTOMY  03/10/2012   Procedure: MINOR CAPSULOTOMY;  Surgeon: Vita Erm., MD;  Location: [facility] OR;  Service: Ophthalmology;  Laterality: Left;  Left Eye   CATARACT EXTRACTION W/PHACO  11/25/2011   Procedure: CATARACT EXTRACTION PHACO AND INTRAOCULAR LENS PLACEMENT (IOC);  Surgeon: Shade Flood, MD;  Location[facility];  Service: Ophthalmology;  Laterality: Left;   CATARACT EXTRACTION W/PHACO  12/07/2011   Procedure: CATARACT EXTRACTION PHACO AND INTRAOCULAR LENS PLACEMENT (IOC);  Surgeon: Shade Flood, MD;  Location: [facility] OR;  Service: Ophthalmology;  Laterality: Right;   cataract surgery      left   CESAREAN SECTION  2000   COLONOSCOPY     MYOMECTOMY  1996   REFRACTIVE SURGERY Bilateral 02/2018   for glaucoma, performed by Dr. Harlon Flor   TONSILLECTOMY AND ADENOIDECTOMY  1964   as a child   WISDOM TOOTH EXTRACTION  1970    FAMILY HISTORY: Family History  Problem Relation Age of Onset   Hypotension Mother    Hypertension Mother    Diabetes Mother    Dementia Mother    GI problems Mother    Colon cancer Father    Cancer - Colon Father    Hypertension Sister    Hypertension Paternal Grandmother    Hypertension Paternal Grandfather    Healthy Son    Diabetes Other    Stroke Other    Hypertension Other    Heart disease Other    Anesthesia problems Neg Hx    Malignant hyperthermia Neg Hx    Pseudochol deficiency Neg Hx    Sleep apnea Neg Hx     SOCIAL HISTORY: Social History   Socioeconomic History   Marital status: Married    Spouse name: Not on file   Number of children: Not on file   Years of education: Not on  file   Highest education level: Not on file  Occupational History   Not on file  Tobacco Use   Smoking status: Never    Passive exposure: Past   Smokeless tobacco: Never  Vaping Use   Vaping Use: Never used  Substance and Sexual Activity   Alcohol use: Not Currently   Drug use: No   Sexual activity: Yes    Birth control/protection: Pill  Other Topics  Concern   Not on file  Social History Narrative   Caffiene; 12 oz every morning.     Working: Teaching laboratory technician   Married with one adult child (living in [place])   Husband as CPAP   Social Determinants of Health   Financial Resource Strain: Patient Declined (11/18/2022)   Overall Financial Resource Strain (CARDIA)    Difficulty of Paying Living Expenses: Patient declined  Food Insecurity: Patient Declined (11/18/2022)   Hunger Vital Sign    Worried About Running Out of Food in the Last Year: Patient declined    Ran Out of Food in the Last Year: Patient declined  Transportation Needs: Patient Declined (11/18/2022)   PRAPARE - Administrator, Civil Service (Medical): Patient declined    Lack of Transportation (Non-Medical): Patient declined  Physical Activity: Unknown (11/18/2022)   Exercise Vital Sign    Days of Exercise per Week: Patient declined    Minutes of Exercise per Session: Not on file  Stress: Patient Declined (11/18/2022)   [employer] of Occupational Health - Occupational Stress Questionnaire    Feeling of Stress : Patient declined  Social Connections: Unknown (11/18/2022)   Social Connection and Isolation Panel [NHANES]    Frequency of Communication with Friends and Family: Patient declined    Frequency of Social Gatherings with Friends and Family: Patient declined    Attends Religious Services: Patient declined    Database administrator or Organizations: Patient declined    Attends Banker Meetings: Not on file    Marital Status: Married  Intimate Partner Violence: Not on file       PHYSICAL EXAM  Vitals:   12/08/22 0922  Weight: 186 lb (84.4 kg)  Height: 5\' 3"  (1.6 m)   Body mass index is 32.95 kg/m.  Generalized: Well developed, in no acute distress  Chest: Lungs clear to auscultation bilaterally  Neurological examination  Mentation: Alert oriented to time, place, history taking. Follows all commands speech and language fluent Cranial nerve II-XII: Facial symmetry noted Motor: The motor testing reveals 5 over 5 strength of all 4 extremities. Good symmetric motor tone is noted throughout.  Gait and station: Gait is normal.    DIAGNOSTIC DATA (LABS, IMAGING, TESTING) - I reviewed patient records, labs, notes, testing and imaging myself where available.  Lab Results  Component Value Date   WBC 3.7 05/19/2022   HGB 13.7 05/19/2022   HCT 40.8 05/19/2022   MCV 86 05/19/2022   PLT 244 05/19/2022      Component Value Date/Time   NA 140 11/17/2022 0844   K 4.5 11/17/2022 0844   CL 103 11/17/2022 0844   CO2 23 11/17/2022 0844   GLUCOSE 85 11/17/2022 0844   BUN 11 11/17/2022 0844   CREATININE 0.71 11/17/2022 0844   CALCIUM 9.2 11/17/2022 0844   PROT 6.6 11/17/2022 0844   ALBUMIN 3.9 11/17/2022 0844   AST 91 (H) 11/17/2022 0844   ALT 161 (H) 11/17/2022 0844   ALT 159 (H) 11/17/2022 0844   ALKPHOS 121 11/17/2022 0844   BILITOT 0.4 11/17/2022 0844   GFRNONAA 90 04/25/2020 1024   GFRAA 104 04/25/2020 1024   Lab Results  Component Value Date   CHOL 140 11/17/2022   HDL 63 11/17/2022   LDLCALC 62 11/17/2022   TRIG 79 11/17/2022   CHOLHDL 2.2 11/17/2022   Lab Results  Component Value Date   HGBA1C 6.0 (H) 05/19/2022   No results found for: "VITAMINB12" Lab Results  Component  Value Date   TSH 1.890 05/26/2022      ASSESSMENT AND PLAN 67 y.o. year old female  has a past medical history of Anemia, Asthma, Cataracts, bilateral, Chicken pox, Complication of anesthesia (2000), Eczema, seasonal allergies, Hyperlipidemia, Measles, Mumps,  and Vitamin D deficiency. here with:  OSA on CPAP  - CPAP compliance excellent - Good treatment of AHI  - Encourage patient to use CPAP nightly and > 4 hours each night - F/U in 1 year or sooner if needed   Butch Penny, MSN, NP-C 12/08/2022, 9:23 AM [facility] [address] [place], [place] [address] [phone]

## 2022-12-08 NOTE — Addendum Note (Signed)
Addended by: Marlyn Corporal on: 12/08/2022 11:07 AM   Modules accepted: Orders

## 2022-12-08 NOTE — Patient Instructions (Signed)
Hypertension, Adult Hypertension is another name for high blood pressure. High blood pressure forces your heart to work harder to pump blood. This can cause problems over time. There are two numbers in a blood pressure reading. There is a top number (systolic) over a bottom number (diastolic). It is best to have a blood pressure that is below 120/80. What are the causes? The cause of this condition is not known. Some other conditions can lead to high blood pressure. What increases the risk? Some lifestyle factors can make you more likely to develop high blood pressure: Smoking. Not getting enough exercise or physical activity. Being overweight. Having too much fat, sugar, calories, or salt (sodium) in your diet. Drinking too much alcohol. Other risk factors include: Having any of these conditions: Heart disease. Diabetes. High cholesterol. Kidney disease. Obstructive sleep apnea. Having a family history of high blood pressure and high cholesterol. Age. The risk increases with age. Stress. What are the signs or symptoms? High blood pressure may not cause symptoms. Very high blood pressure (hypertensive crisis) may cause: Headache. Fast or uneven heartbeats (palpitations). Shortness of breath. Nosebleed. Vomiting or feeling like you may vomit (nauseous). Changes in how you see. Very bad chest pain. Feeling dizzy. Seizures. How is this treated? This condition is treated by making healthy lifestyle changes, such as: Eating healthy foods. Exercising more. Drinking less alcohol. Your doctor may prescribe medicine if lifestyle changes do not help enough and if: Your top number is above 130. Your bottom number is above 80. Your personal target blood pressure may vary. Follow these instructions at home: Eating and drinking  If told, follow the DASH eating plan. To follow this plan: Fill one half of your plate at each meal with fruits and vegetables. Fill one fourth of your plate  at each meal with whole grains. Whole grains include whole-wheat pasta, brown rice, and whole-grain bread. Eat or drink low-fat dairy products, such as skim milk or low-fat yogurt. Fill one fourth of your plate at each meal with low-fat (lean) proteins. Low-fat proteins include fish, chicken without skin, eggs, beans, and tofu. Avoid fatty meat, cured and processed meat, or chicken with skin. Avoid pre-made or processed food. Limit the amount of salt in your diet to less than 1,500 mg each day. Do not drink alcohol if: Your doctor tells you not to drink. You are pregnant, may be pregnant, or are planning to become pregnant. If you drink alcohol: Limit how much you have to: 0-1 drink a day for women. 0-2 drinks a day for men. Know how much alcohol is in your drink. In the [place] one drink equals one 12 oz bottle of beer (355 mL), one 5 oz glass of wine (148 mL), or one 1 oz glass of hard liquor (44 mL). Lifestyle  Work with your doctor to stay at a healthy weight or to lose weight. Ask your doctor what the best weight is for you. Get at least 30 minutes of exercise that causes your heart to beat faster (aerobic exercise) most days of the week. This may include walking, swimming, or biking. Get at least 30 minutes of exercise that strengthens your muscles (resistance exercise) at least 3 days a week. This may include lifting weights or doing Pilates. Do not smoke or use any products that contain nicotine or tobacco. If you need help quitting, ask your doctor. Check your blood pressure at home as told by your doctor. Keep all follow-up visits. Medicines Take over-the-counter and prescription medicines   only as told by your doctor. Follow directions carefully. Do not skip doses of blood pressure medicine. The medicine does not work as well if you skip doses. Skipping doses also puts you at risk for problems. Ask your doctor about side effects or reactions to medicines that you should watch  for. Contact a doctor if: You think you are having a reaction to the medicine you are taking. You have headaches that keep coming back. You feel dizzy. You have swelling in your ankles. You have trouble with your vision. Get help right away if: You get a very bad headache. You start to feel mixed up (confused). You feel weak or numb. You feel faint. You have very bad pain in your: Chest. Belly (abdomen). You vomit more than once. You have trouble breathing. These symptoms may be an emergency. Get help right away. Call 911. Do not wait to see if the symptoms will go away. Do not drive yourself to the hospital. Summary Hypertension is another name for high blood pressure. High blood pressure forces your heart to work harder to pump blood. For most people, a normal blood pressure is less than 120/80. Making healthy choices can help lower blood pressure. If your blood pressure does not get lower with healthy choices, you may need to take medicine. This information is not intended to replace advice given to you by your health care provider. Make sure you discuss any questions you have with your health care provider. Document Revised: 04/03/2021 Document Reviewed: 04/03/2021 Elsevier Patient Education  2024 Elsevier Inc.  

## 2022-12-08 NOTE — Patient Instructions (Signed)
Continue using CPAP nightly and greater than 4 hours each night °If your symptoms worsen or you develop new symptoms please let us know.  ° °

## 2022-12-09 ENCOUNTER — Encounter: Payer: Self-pay | Admitting: Internal Medicine

## 2022-12-09 ENCOUNTER — Other Ambulatory Visit: Payer: Self-pay

## 2022-12-17 LAB — PROTEIN ELECTROPHORESIS, SERUM, WITH REFLEX
A/G Ratio: 1.1 (ref 0.7–1.7)
Albumin ELP: 3.6 g/dL (ref 2.9–4.4)
Alpha 1: 0.2 g/dL (ref 0.0–0.4)
Alpha 2: 0.7 g/dL (ref 0.4–1.0)
Beta: 1.2 g/dL (ref 0.7–1.3)
Gamma Globulin: 1.2 g/dL (ref 0.4–1.8)
Globulin, Total: 3.3 g/dL (ref 2.2–3.9)
Interpretation(See Below): 0
M-Spike, %: 0.3 g/dL — ABNORMAL HIGH
Total Protein: 6.9 g/dL (ref 6.0–8.5)

## 2022-12-17 LAB — PHOSPHORUS: Phosphorus: 3.5 mg/dL (ref 3.0–4.3)

## 2022-12-17 LAB — IMMUNOFIXATION REFLEX, SERUM
IgA/Immunoglobulin A, Serum: 201 mg/dL (ref 87–352)
IgG (Immunoglobin G), Serum: 1238 mg/dL (ref 586–1602)
IgM (Immunoglobulin M), Srm: 188 mg/dL (ref 26–217)

## 2022-12-17 LAB — VITAMIN D 25 HYDROXY (VIT D DEFICIENCY, FRACTURES): Vit D, 25-Hydroxy: 63.8 ng/mL (ref 30.0–100.0)

## 2022-12-17 LAB — PARATHYROID HORMONE, INTACT (NO CA): PTH: 68 pg/mL — ABNORMAL HIGH (ref 15–65)

## 2022-12-18 ENCOUNTER — Ambulatory Visit (HOSPITAL_BASED_OUTPATIENT_CLINIC_OR_DEPARTMENT_OTHER)
Admission: RE | Admit: 2022-12-18 | Discharge: 2022-12-18 | Disposition: A | Discharge status: Home / Self Care | Payer: Medicare HMO | Source: Ambulatory Visit | Attending: Internal Medicine | Admitting: Internal Medicine

## 2022-12-18 DIAGNOSIS — E7800 Pure hypercholesterolemia, unspecified: Secondary | ICD-10-CM

## 2022-12-21 ENCOUNTER — Other Ambulatory Visit: Payer: Self-pay | Admitting: *Deleted

## 2022-12-21 DIAGNOSIS — R899 Unspecified abnormal finding in specimens from other organs, systems and tissues: Secondary | ICD-10-CM

## 2022-12-21 NOTE — Progress Notes (Signed)
PTH is mildly elevated and not significant.  IFE shows IgM monoclonal protein.  Please refer patient to hematology for evaluation.  Vitamin D normal, phosphorus normal.  Patient decided not to take the treatment for osteoporosis at the last visit.  Please forward results to her PCP.

## 2022-12-23 ENCOUNTER — Encounter: Payer: Self-pay | Admitting: Internal Medicine

## 2022-12-24 ENCOUNTER — Ambulatory Visit: Payer: No Typology Code available for payment source | Admitting: Rheumatology

## 2022-12-31 LAB — HEPATIC FUNCTION PANEL
ALT: 56 IU/L — ABNORMAL HIGH (ref 0–32)
AST: 38 IU/L (ref 0–40)
Albumin: 4 g/dL (ref 3.9–4.9)
Alkaline Phosphatase: 129 IU/L — ABNORMAL HIGH (ref 44–121)
Bilirubin Total: 0.3 mg/dL (ref 0.0–1.2)
Bilirubin, Direct: 0.1 mg/dL (ref 0.00–0.40)
Total Protein: 7 g/dL (ref 6.0–8.5)

## 2022-12-31 LAB — SPECIMEN STATUS REPORT

## 2023-01-01 NOTE — Progress Notes (Signed)
Liver functions are still elevated.  I will forward results to Dr. Allyne Gee

## 2023-01-09 ENCOUNTER — Inpatient Hospital Stay: Payer: Medicare HMO | Attending: Hematology

## 2023-01-09 ENCOUNTER — Inpatient Hospital Stay (HOSPITAL_BASED_OUTPATIENT_CLINIC_OR_DEPARTMENT_OTHER): Payer: Medicare HMO | Admitting: Hematology

## 2023-01-09 ENCOUNTER — Encounter: Payer: Self-pay | Admitting: Hematology

## 2023-01-09 VITALS — BP 144/86 | HR 98 | Temp 98.1°F | Resp 20 | Wt 190.6 lb

## 2023-01-09 DIAGNOSIS — D472 Monoclonal gammopathy: Secondary | ICD-10-CM

## 2023-01-09 DIAGNOSIS — Z800 Family history of malignant neoplasm of digestive organs: Secondary | ICD-10-CM | POA: Insufficient documentation

## 2023-01-09 DIAGNOSIS — M810 Age-related osteoporosis without current pathological fracture: Secondary | ICD-10-CM | POA: Insufficient documentation

## 2023-01-09 LAB — CBC WITH DIFFERENTIAL/PLATELET
Abs Immature Granulocytes: 0.01 10*3/uL (ref 0.00–0.07)
Basophils Absolute: 0 10*3/uL (ref 0.0–0.1)
Basophils Relative: 1 %
Eosinophils Absolute: 0.1 10*3/uL (ref 0.0–0.5)
Eosinophils Relative: 1 %
HCT: 39.3 % (ref 36.0–46.0)
Hemoglobin: 13 g/dL (ref 12.0–15.0)
Immature Granulocytes: 0 %
Lymphocytes Relative: 32 %
Lymphs Abs: 1.2 10*3/uL (ref 0.7–4.0)
MCH: 29.1 pg (ref 26.0–34.0)
MCHC: 33.1 g/dL (ref 30.0–36.0)
MCV: 88.1 fL (ref 80.0–100.0)
Monocytes Absolute: 0.2 10*3/uL (ref 0.1–1.0)
Monocytes Relative: 6 %
Neutro Abs: 2.2 10*3/uL (ref 1.7–7.7)
Neutrophils Relative %: 60 %
Platelets: 243 10*3/uL (ref 150–400)
RBC: 4.46 MIL/uL (ref 3.87–5.11)
RDW: 13.7 % (ref 11.5–15.5)
WBC: 3.8 10*3/uL — ABNORMAL LOW (ref 4.0–10.5)
nRBC: 0 % (ref 0.0–0.2)

## 2023-01-09 NOTE — Progress Notes (Signed)
[facility]   [phone] [phone]   Clinic New Consult Note   Patient Care Team: Dorothyann Peng, MD as PCP - General (Internal Medicine) 01/09/2023  CHIEF COMPLAINTS/PURPOSE OF CONSULTATION:  Abnormal SPEP  Referring physician Dr. Corliss Skains   HISTORY OF PRESENTING ILLNESS:  Maria Kline 67 y.o. female is here because of abnormal lab results of SPEP.  She was referred by her rheumatologist  Dr. Corliss Skains for osteoporosis.  During the workup, SPEP was abnormal, M protein was 0.3.  She was referred for further evaluation.  She is otherwise pretty healthy and active, denies any bone pain, fatigue, or other new symptoms.  No recent weight loss.  Her husband has smoldering multiple myeloma, she is familiar with lab work and symptoms of multiple myeloma.  MEDICAL HISTORY:  Past Medical History:  Diagnosis Date   Anemia    78yrs ago   Asthma    Cataracts, bilateral    Chicken pox    Complication of anesthesia 2000   c-section; excessive shivering   Eczema    hx of   Hx of seasonal allergies    Hyperlipidemia    Measles    Mumps    Vitamin D deficiency    Vit D on tues and fri    SURGICAL HISTORY: Past Surgical History:  Procedure Laterality Date   adenodectomy     CAPSULOTOMY  03/10/2012   Procedure: MINOR CAPSULOTOMY;  Surgeon: Vita Erm., MD;  Location: [facility] OR;  Service: Ophthalmology;  Laterality: Left;  Left Eye   CATARACT EXTRACTION W/PHACO  11/25/2011   Procedure: CATARACT EXTRACTION PHACO AND INTRAOCULAR LENS PLACEMENT (IOC);  Surgeon: Shade Flood, MD;  Location: [facility] OR;  Service: Ophthalmology;  Laterality: Left;   CATARACT EXTRACTION W/PHACO  12/07/2011   Procedure: CATARACT EXTRACTION PHACO AND INTRAOCULAR LENS PLACEMENT (IOC);  Surgeon: Shade Flood, MD;  Location: [facility] OR;  Service: Ophthalmology;  Laterality: Right;   cataract surgery      left   CESAREAN SECTION  2000   COLONOSCOPY     MYOMECTOMY  1996    REFRACTIVE SURGERY Bilateral 02/2018   for glaucoma, performed by Dr. Harlon Flor   TONSILLECTOMY AND ADENOIDECTOMY  1964   as a child   WISDOM TOOTH EXTRACTION  1970    SOCIAL HISTORY: Social History   Socioeconomic History   Marital status: Married    Spouse name: Not on file   Number of children: 1   Years of education: Not on file   Highest education level: Not on file  Occupational History   Not on file  Tobacco Use   Smoking status: Never    Passive exposure: Past   Smokeless tobacco: Never  Vaping Use   Vaping status: Never Used  Substance and Sexual Activity   Alcohol use: Not Currently   Drug use: No   Sexual activity: Yes    Birth control/protection: Pill  Other Topics Concern   Not on file  Social History Narrative   Caffiene; 12 oz every morning.     Working: Teaching laboratory technician   Married with one adult child (living in [place])   Husband as CPAP   Social Determinants of Health   Financial Resource Strain: Patient Declined (11/18/2022)   Overall Financial Resource Strain (CARDIA)    Difficulty of Paying Living Expenses: Patient declined  Food Insecurity: No Food Insecurity (01/09/2023)   Hunger Vital Sign    Worried About Running Out of Food in the Last  Year: Never true    Ran Out of Food in the Last Year: Never true  Transportation Needs: No Transportation Needs (01/09/2023)   PRAPARE - Administrator, Civil Service (Medical): No    Lack of Transportation (Non-Medical): No  Physical Activity: Unknown (11/18/2022)   Exercise Vital Sign    Days of Exercise per Week: Patient declined    Minutes of Exercise per Session: Not on file  Stress: Patient Declined (11/18/2022)   [employer] of Occupational Health - Occupational Stress Questionnaire    Feeling of Stress : Patient declined  Social Connections: Unknown (11/18/2022)   Social Connection and Isolation Panel [NHANES]    Frequency of Communication with Friends and Family: Patient declined     Frequency of Social Gatherings with Friends and Family: Patient declined    Attends Religious Services: Patient declined    Database administrator or Organizations: Patient declined    Attends Banker Meetings: Not on file    Marital Status: Married  Intimate Partner Violence: Not At Risk (01/09/2023)   Humiliation, Afraid, Rape, and Kick questionnaire    Fear of Current or Ex-Partner: No    Emotionally Abused: No    Physically Abused: No    Sexually Abused: No    FAMILY HISTORY: Family History  Problem Relation Age of Onset   Hypotension Mother    Hypertension Mother    Diabetes Mother    Dementia Mother    GI problems Mother    Colon cancer Father    Cancer - Colon Father    Hypertension Sister    Hypertension Paternal Grandmother    Hypertension Paternal Grandfather    Healthy Son    Diabetes Other    Stroke Other    Hypertension Other    Heart disease Other    Anesthesia problems Neg Hx    Malignant hyperthermia Neg Hx    Pseudochol deficiency Neg Hx    Sleep apnea Neg Hx     ALLERGIES:  is allergic to demerol [meperidine] and penicillins.  MEDICATIONS:  Current Outpatient Medications  Medication Sig Dispense Refill   albuterol (VENTOLIN HFA) 108 (90 Base) MCG/ACT inhaler TAKE 2 PUFFS BY MOUTH EVERY 6 HOURS AS NEEDED FOR WHEEZE OR SHORTNESS OF BREATH 18 each 5   amLODipine (NORVASC) 2.5 MG tablet Take 1 tablet (2.5 mg total) by mouth daily. 30 tablet 11   atorvastatin (LIPITOR) 40 MG tablet TAKE 1 TABLET BY MOUTH EVERY DAY 90 tablet 2   cetirizine (ZYRTEC) 10 MG tablet Take 10 mg by mouth as needed.     Cholecalciferol (VITAMIN D3) 125 MCG (5000 UT) CAPS Take by mouth.     HYDROcodone bit-homatropine (HYDROMET) 5-1.5 MG/5ML syrup Take 5 mLs by mouth every 6 (six) hours as needed for cough. 120 mL 0   LUMIGAN 0.01 % SOLN Place 1 drop into both eyes at bedtime.     MAGNESIUM PO Take 25 mg by mouth daily at 12 noon.     Mometasone Furoate (ASMANEX HFA)  100 MCG/ACT AERO Inhale into the lungs as needed.     montelukast (SINGULAIR) 10 MG tablet TAKE 1 TABLET BY MOUTH EVERY DAY (Patient taking differently: Take 10 mg by mouth as needed.) 90 tablet 2   No current facility-administered medications for this visit.    REVIEW OF SYSTEMS:   Constitutional: Denies fevers, chills or abnormal night sweats Eyes: Denies blurriness of vision, double vision or watery eyes Ears, nose, mouth, throat, and  face: Denies mucositis or sore throat Respiratory: Denies cough, dyspnea or wheezes Cardiovascular: Denies palpitation, chest discomfort or lower extremity swelling Gastrointestinal:  Denies nausea, heartburn or change in bowel habits Skin: Denies abnormal skin rashes Lymphatics: Denies new lymphadenopathy or easy bruising Neurological:Denies numbness, tingling or new weaknesses Behavioral/Psych: Mood is stable, no new changes  All other systems were reviewed with the patient and are negative.  PHYSICAL EXAMINATION: ECOG PERFORMANCE STATUS: 0 - Asymptomatic  Vitals:   01/09/23 1050  BP: (!) 144/86  Pulse: 98  Resp: 20  Temp: 98.1 F (36.7 C)  SpO2: 100%   Filed Weights   01/09/23 1050  Weight: 190 lb 9.6 oz (86.5 kg)    GENERAL:alert, no distress and comfortable SKIN: skin color, texture, turgor are normal, no rashes or significant lesions EYES: normal, conjunctiva are pink and non-injected, sclera clear OROPHARYNX:no exudate, no erythema and lips, buccal mucosa, and tongue normal  NECK: supple, thyroid normal size, non-tender, without nodularity LYMPH:  no palpable lymphadenopathy in the cervical, axillary or inguinal LUNGS: clear to auscultation and percussion with normal breathing effort HEART: regular rate & rhythm and no murmurs and no lower extremity edema ABDOMEN:abdomen soft, non-tender and normal bowel sounds Musculoskeletal:no cyanosis of digits and no clubbing  PSYCH: alert & oriented x 3 with fluent speech NEURO: no focal  motor/sensory deficits  LABORATORY DATA:  I have reviewed the data as listed    Latest Ref Rng & Units 01/09/2023   11:21 AM 05/19/2022   10:19 AM 05/07/2021    5:21 PM  CBC  WBC 4.0 - 10.5 K/uL 3.8  3.7  3.2   Hemoglobin 12.0 - 15.0 g/dL 09.8  11.9  14.7   Hematocrit 36.0 - 46.0 % 39.3  40.8  37.8   Platelets 150 - 400 K/uL 243  244  227     @cmpl @  RADIOGRAPHIC STUDIES: I have personally reviewed the radiological images as listed and agreed with the findings in the report. CT CARDIAC SCORING (SELF PAY ONLY)  Addendum Date: 12/22/2022   ADDENDUM REPORT: 12/22/2022 18:17 EXAM: OVER-READ INTERPRETATION  CT CHEST The following report is an over-read performed by radiologist Dr. Narda Rutherford of [facility] on 12/22/2022. This over-read does not include interpretation of cardiac or coronary anatomy or pathology. The coronary CTA interpretation by the cardiologist is attached. COMPARISON:  None. FINDINGS: Vascular: No aortic atherosclerosis. The included aorta is normal in caliber. Mediastinum/nodes: No adenopathy or mass. Unremarkable esophagus. Lungs: No focal airspace disease. No pulmonary nodule. No pleural fluid. The included airways are patent. Upper abdomen: No acute or unexpected findings. Musculoskeletal: There are no acute or suspicious osseous abnormalities. IMPRESSION: No acute or unexpected extracardiac findings. Electronically Signed   By: Narda Rutherford M.D.   On: 12/22/2022 18:17   Result Date: 12/22/2022 CLINICAL DATA:  Cardiovascular Disease Risk stratification EXAM: Coronary Calcium Score TECHNIQUE: A gated, non-contrast computed tomography scan of the heart was performed using 3mm slice thickness. Axial images were analyzed on a dedicated workstation. Calcium scoring of the coronary arteries was performed using the Agatston method. FINDINGS: Coronary arteries: Normal origins. Coronary Calcium Score: Left main: Left anterior descending artery: Left circumflex  artery: Right coronary artery: Total: 0 Percentile: Pericardium: Normal. Ascending Aorta: Normal caliber. Non-cardiac: See separate report from [facility]. IMPRESSION: Coronary calcium score of 0. RECOMMENDATIONS: Coronary artery calcium (CAC) score is a strong predictor of incident coronary heart disease (CHD) and provides predictive information beyond traditional risk factors. CAC scoring is reasonable  to use in the decision to withhold, postpone, or initiate statin therapy in intermediate-risk or selected borderline-risk asymptomatic adults (age 82-75 years and LDL-C >=70 to <190 mg/dL) who do not have diabetes or established atherosclerotic cardiovascular disease (ASCVD).* In intermediate-risk (10-year ASCVD risk >=7.5% to <20%) adults or selected borderline-risk (10-year ASCVD risk >=5% to <7.5%) adults in whom a CAC score is measured for the purpose of making a treatment decision the following recommendations have been made: If CAC=0, it is reasonable to withhold statin therapy and reassess in 5 to 10 years, as long as higher risk conditions are absent (diabetes mellitus, family history of premature CHD in first degree relatives (males <55 years; females <65 years), cigarette smoking, or LDL >=190 mg/dL). If CAC is 1 to 99, it is reasonable to initiate statin therapy for patients >=67 years of age. If CAC is >=100 or >=75th percentile, it is reasonable to initiate statin therapy at any age. Cardiology referral should be considered for patients with CAC scores >=400 or >=75th percentile. *2018 AHA/ACC/AACVPR/AAPA/ABC/ACPM/ADA/AGS/APhA/ASPC/NLA/PCNA Guideline on the Management of Blood Cholesterol: A Report of the American College of Cardiology/American Heart Association Task Force on Clinical Practice Guidelines. J Am Coll Cardiol. 2019;73(24):3168-3209. Epifanio Lesches, MD Electronically Signed: By: Epifanio Lesches M.D. On: 12/18/2022 18:52    ASSESSMENT & PLAN:  67 year old female with  history of osteoporosis, was found to have positive M protein on routine lab work for osteoporosis.  MGUS, IgM  -This was discovered on routine lab work, M protein was positive but low level, 0.3, immunofixation was positive for IgM.  Serum IgM level was normal -No anemia, her renal function and calcium level has been normal lately.  No clinical concern for multiple myeloma -Will check her serum light chain, and a urine light chain, and obtain bone survey -If the above workup is unremarkable, plan to repeat lab and follow-up in 6 months, if M protein stable, likely will see her once a year. -We reviewed multiple myeloma symptoms, and what to watch.  She knows to call us if she has any new concerns before next appointment.  Plan -lab today -lab and f/u with APP in 6 months   Orders Placed This Encounter  Procedures   DG Bone Survey Met    Standing Status:   Future    Standing Expiration Date:   01/09/2024    Order Specific Question:   Reason for Exam (SYMPTOM  OR DIAGNOSIS REQUIRED)    Answer:   rule out lytic bone lesion    Order Specific Question:   Preferred imaging location?    Answer:   [facility]   CBC with Differential/Platelet    Standing Status:   Standing    Number of Occurrences:   50    Standing Expiration Date:   01/09/2024   Kappa/lambda light chains    Standing Status:   Future    Number of Occurrences:   1    Standing Expiration Date:   01/09/2024   24-Hr Ur UPEP/UIFE/Light Chains/TP    Standing Status:   Future    Standing Expiration Date:   01/09/2024    All questions were answered. The patient knows to call the clinic with any problems, questions or concerns. I spent 25 minutes counseling the patient face to face. The total time spent in the appointment was 30 minutes and more than 50% was on counseling.     Malachy Mood, MD 01/09/2023 11:57 AM

## 2023-01-12 LAB — KAPPA/LAMBDA LIGHT CHAINS
Kappa free light chain: 14.1 mg/L (ref 3.3–19.4)
Kappa, lambda light chain ratio: 1.17 (ref 0.26–1.65)
Lambda free light chains: 12.1 mg/L (ref 5.7–26.3)

## 2023-01-18 ENCOUNTER — Other Ambulatory Visit: Payer: Self-pay

## 2023-01-18 ENCOUNTER — Ambulatory Visit (HOSPITAL_BASED_OUTPATIENT_CLINIC_OR_DEPARTMENT_OTHER)
Admission: RE | Admit: 2023-01-18 | Discharge: 2023-01-18 | Disposition: A | Discharge status: Home / Self Care | Payer: Medicare HMO | Source: Ambulatory Visit | Attending: Hematology | Admitting: Hematology

## 2023-01-18 DIAGNOSIS — D472 Monoclonal gammopathy: Secondary | ICD-10-CM | POA: Diagnosis not present

## 2023-01-18 DIAGNOSIS — M47812 Spondylosis without myelopathy or radiculopathy, cervical region: Secondary | ICD-10-CM | POA: Diagnosis not present

## 2023-01-18 DIAGNOSIS — M47814 Spondylosis without myelopathy or radiculopathy, thoracic region: Secondary | ICD-10-CM | POA: Diagnosis not present

## 2023-01-20 LAB — UPEP/UIFE/LIGHT CHAINS/TP, 24-HR UR
% BETA, Urine: 0 %
ALPHA 1 URINE: 0 %
Albumin, U: 100 %
Alpha 2, Urine: 0 %
Free Kappa Lt Chains,Ur: 5.12 mg/L (ref 1.17–86.46)
Free Kappa/Lambda Ratio: 6.32 (ref 1.83–14.26)
Free Lambda Lt Chains,Ur: 0.81 mg/L (ref 0.27–15.21)
GAMMA GLOBULIN URINE: 0 %
Total Protein, Urine-Ur/day: 117 mg/24 hr (ref 30–150)
Total Protein, Urine: 4.6 mg/dL
Total Volume: 2550

## 2023-01-27 DIAGNOSIS — G4733 Obstructive sleep apnea (adult) (pediatric): Secondary | ICD-10-CM | POA: Diagnosis not present

## 2023-02-08 ENCOUNTER — Telehealth: Payer: Self-pay

## 2023-02-08 NOTE — Telephone Encounter (Addendum)
Called patient to relay message below as per Dr.Feng. Patient voiced full understanding.    ----- Message from Malachy Mood sent at 02/07/2023  7:26 PM EDT ----- Please let pt know her bone survey was negative, good news.   Malachy Mood

## 2023-03-03 NOTE — Patient Instructions (Signed)
Hypertension, Adult High blood pressure (hypertension) is when the force of blood pumping through the arteries is too strong. The arteries are the blood vessels that carry blood from the heart throughout the body. Hypertension forces the heart to work harder to pump blood and may cause arteries to become narrow or stiff. Untreated or uncontrolled hypertension can lead to a heart attack, heart failure, a stroke, kidney disease, and other problems. A blood pressure reading consists of a higher number over a lower number. Ideally, your blood pressure should be below 120/80. The first ("top") number is called the systolic pressure. It is a measure of the pressure in your arteries as your heart beats. The second ("bottom") number is called the diastolic pressure. It is a measure of the pressure in your arteries as the heart relaxes. What are the causes? The exact cause of this condition is not known. There are some conditions that result in high blood pressure. What increases the risk? Certain factors may make you more likely to develop high blood pressure. Some of these risk factors are under your control, including: Smoking. Not getting enough exercise or physical activity. Being overweight. Having too much fat, sugar, calories, or salt (sodium) in your diet. Drinking too much alcohol. Other risk factors include: Having a personal history of heart disease, diabetes, high cholesterol, or kidney disease. Stress. Having a family history of high blood pressure and high cholesterol. Having obstructive sleep apnea. Age. The risk increases with age. What are the signs or symptoms? High blood pressure may not cause symptoms. Very high blood pressure (hypertensive crisis) may cause: Headache. Fast or irregular heartbeats (palpitations). Shortness of breath. Nosebleed. Nausea and vomiting. Vision changes. Severe chest pain, dizziness, and seizures. How is this diagnosed? This condition is diagnosed by  measuring your blood pressure while you are seated, with your arm resting on a flat surface, your legs uncrossed, and your feet flat on the floor. The cuff of the blood pressure monitor will be placed directly against the skin of your upper arm at the level of your heart. Blood pressure should be measured at least twice using the same arm. Certain conditions can cause a difference in blood pressure between your right and left arms. If you have a high blood pressure reading during one visit or you have normal blood pressure with other risk factors, you may be asked to: Return on a different day to have your blood pressure checked again. Monitor your blood pressure at home for 1 week or longer. If you are diagnosed with hypertension, you may have other blood or imaging tests to help your health care provider understand your overall risk for other conditions. How is this treated? This condition is treated by making healthy lifestyle changes, such as eating healthy foods, exercising more, and reducing your alcohol intake. You may be referred for counseling on a healthy diet and physical activity. Your health care provider may prescribe medicine if lifestyle changes are not enough to get your blood pressure under control and if: Your systolic blood pressure is above 130. Your diastolic blood pressure is above 80. Your personal target blood pressure may vary depending on your medical conditions, your age, and other factors. Follow these instructions at home: Eating and drinking  Eat a diet that is high in fiber and potassium, and low in sodium, added sugar, and fat. An example of this eating plan is called the DASH diet. DASH stands for Dietary Approaches to Stop Hypertension. To eat this way: Eat   plenty of fresh fruits and vegetables. Try to fill one half of your plate at each meal with fruits and vegetables. Eat whole grains, such as whole-wheat pasta, brown rice, or whole-grain bread. Fill about one  fourth of your plate with whole grains. Eat or drink low-fat dairy products, such as skim milk or low-fat yogurt. Avoid fatty cuts of meat, processed or cured meats, and poultry with skin. Fill about one fourth of your plate with lean proteins, such as fish, chicken without skin, beans, eggs, or tofu. Avoid pre-made and processed foods. These tend to be higher in sodium, added sugar, and fat. Reduce your daily sodium intake. Many people with hypertension should eat less than 1,500 mg of sodium a day. Do not drink alcohol if: Your health care provider tells you not to drink. You are pregnant, may be pregnant, or are planning to become pregnant. If you drink alcohol: Limit how much you have to: 0-1 drink a day for women. 0-2 drinks a day for men. Know how much alcohol is in your drink. In the [place] one drink equals one 12 oz bottle of beer (355 mL), one 5 oz glass of wine (148 mL), or one 1 oz glass of hard liquor (44 mL). Lifestyle  Work with your health care provider to maintain a healthy body weight or to lose weight. Ask what an ideal weight is for you. Get at least 30 minutes of exercise that causes your heart to beat faster (aerobic exercise) most days of the week. Activities may include walking, swimming, or biking. Include exercise to strengthen your muscles (resistance exercise), such as Pilates or lifting weights, as part of your weekly exercise routine. Try to do these types of exercises for 30 minutes at least 3 days a week. Do not use any products that contain nicotine or tobacco. These products include cigarettes, chewing tobacco, and vaping devices, such as e-cigarettes. If you need help quitting, ask your health care provider. Monitor your blood pressure at home as told by your health care provider. Keep all follow-up visits. This is important. Medicines Take over-the-counter and prescription medicines only as told by your health care provider. Follow directions carefully. Blood  pressure medicines must be taken as prescribed. Do not skip doses of blood pressure medicine. Doing this puts you at risk for problems and can make the medicine less effective. Ask your health care provider about side effects or reactions to medicines that you should watch for. Contact a health care provider if you: Think you are having a reaction to a medicine you are taking. Have headaches that keep coming back (recurring). Feel dizzy. Have swelling in your ankles. Have trouble with your vision. Get help right away if you: Develop a severe headache or confusion. Have unusual weakness or numbness. Feel faint. Have severe pain in your chest or abdomen. Vomit repeatedly. Have trouble breathing. These symptoms may be an emergency. Get help right away. Call 911. Do not wait to see if the symptoms will go away. Do not drive yourself to the hospital. Summary Hypertension is when the force of blood pumping through your arteries is too strong. If this condition is not controlled, it may put you at risk for serious complications. Your personal target blood pressure may vary depending on your medical conditions, your age, and other factors. For most people, a normal blood pressure is less than 120/80. Hypertension is treated with lifestyle changes, medicines, or a combination of both. Lifestyle changes include losing weight, eating a healthy,   low-sodium diet, exercising more, and limiting alcohol. This information is not intended to replace advice given to you by your health care provider. Make sure you discuss any questions you have with your health care provider. Document Revised: 04/22/2021 Document Reviewed: 04/22/2021 Elsevier Patient Education  2024 [employer]  

## 2023-03-04 ENCOUNTER — Ambulatory Visit (INDEPENDENT_AMBULATORY_CARE_PROVIDER_SITE_OTHER): Payer: Medicare Other | Admitting: Internal Medicine

## 2023-03-04 ENCOUNTER — Encounter: Payer: Self-pay | Admitting: Internal Medicine

## 2023-03-04 VITALS — BP 130/84 | HR 71 | Temp 98.2°F | Ht 63.0 in | Wt 190.0 lb

## 2023-03-04 DIAGNOSIS — R748 Abnormal levels of other serum enzymes: Secondary | ICD-10-CM | POA: Diagnosis not present

## 2023-03-04 DIAGNOSIS — Z6833 Body mass index (BMI) 33.0-33.9, adult: Secondary | ICD-10-CM

## 2023-03-04 DIAGNOSIS — I10 Essential (primary) hypertension: Secondary | ICD-10-CM

## 2023-03-04 DIAGNOSIS — R030 Elevated blood-pressure reading, without diagnosis of hypertension: Secondary | ICD-10-CM

## 2023-03-04 DIAGNOSIS — R7309 Other abnormal glucose: Secondary | ICD-10-CM

## 2023-03-04 DIAGNOSIS — E7800 Pure hypercholesterolemia, unspecified: Secondary | ICD-10-CM

## 2023-03-04 DIAGNOSIS — E6609 Other obesity due to excess calories: Secondary | ICD-10-CM

## 2023-03-04 NOTE — Progress Notes (Signed)
I,[name], CMA,acting as a Neurosurgeon for [name], MD.,have documented all relevant documentation on the behalf of [name], MD,as directed by  [name], MD while in the presence of [name], MD.  Subjective:  Patient ID: Maria Kline , female    DOB: 1956/02/27 , 67 y.o.   MRN: [id number]  Chief Complaint  Patient presents with   Hypertension   Hyperlipidemia    HPI  Patient presents today for bp & cholesterol follow up. She reports compliance with medications. Denies headache, chest pain, and SOB. She would like to know if she needs to restart taking atorvastatin? She initially had spike in LFTs, but this has since resolved once she stopped the medication.   Hypertension This is a chronic problem. The current episode started more than 1 year ago. The problem has been gradually improving since onset. Past treatments include calcium channel blockers. The current treatment provides moderate improvement. There are no compliance problems.      Past Medical History:  Diagnosis Date   Anemia    75yrs ago   Asthma    Cataracts, bilateral    Chicken pox    Complication of anesthesia 2000   c-section; excessive shivering   Eczema    hx of   Hx of seasonal allergies    Hyperlipidemia    Measles    Mumps    Vitamin D deficiency    Vit D on tues and fri     Family History  Problem Relation Age of Onset   Hypotension Mother    Hypertension Mother    Diabetes Mother    Dementia Mother    GI problems Mother    Colon cancer Father    Cancer - Colon Father    Hypertension Sister    Hypertension Paternal Grandmother    Hypertension Paternal Grandfather    Healthy Son    Diabetes Other    Stroke Other    Hypertension Other    Heart disease Other    Anesthesia problems Neg Hx    Malignant hyperthermia Neg Hx    Pseudochol deficiency Neg Hx    Sleep apnea Neg Hx      Current Outpatient Medications:    albuterol (VENTOLIN HFA) 108 (90  Base) MCG/ACT inhaler, TAKE 2 PUFFS BY MOUTH EVERY 6 HOURS AS NEEDED FOR WHEEZE OR SHORTNESS OF BREATH, Disp: 18 each, Rfl: 5   amLODipine (NORVASC) 2.5 MG tablet, Take 1 tablet (2.5 mg total) by mouth daily., Disp: 30 tablet, Rfl: 11   cetirizine (ZYRTEC) 10 MG tablet, Take 10 mg by mouth as needed., Disp: , Rfl:    Cholecalciferol (VITAMIN D3) 125 MCG (5000 UT) CAPS, Take by mouth., Disp: , Rfl:    LUMIGAN 0.01 % SOLN, Place 1 drop into both eyes at bedtime., Disp: , Rfl:    MAGNESIUM PO, Take 25 mg by mouth daily at 12 noon., Disp: , Rfl:    Mometasone Furoate (ASMANEX HFA) 100 MCG/ACT AERO, Inhale into the lungs as needed., Disp: , Rfl:    montelukast (SINGULAIR) 10 MG tablet, TAKE 1 TABLET BY MOUTH EVERY DAY (Patient taking differently: Take 10 mg by mouth as needed.), Disp: 90 tablet, Rfl: 2   atorvastatin (LIPITOR) 40 MG tablet, TAKE 1 TABLET BY MOUTH EVERY DAY (Patient not taking: Reported on 03/04/2023), Disp: 90 tablet, Rfl: 2   HYDROcodone bit-homatropine (HYDROMET) 5-1.5 MG/5ML syrup, Take 5 mLs by mouth every 6 (six) hours as  needed for cough. (Patient not taking: Reported on 03/04/2023), Disp: 120 mL, Rfl: 0   Allergies  Allergen Reactions   Demerol [Meperidine] Itching   Penicillins Other (See Comments)    Childhood reaction      Review of Systems  Constitutional: Negative.   Respiratory: Negative.    Cardiovascular: Negative.   Gastrointestinal: Negative.   Neurological: Negative.   Psychiatric/Behavioral: Negative.       Today's Vitals   03/04/23 1416 03/04/23 1436  BP: 138/82 130/84  Pulse: 71   Temp: 98.2 F (36.8 C)   SpO2: 99%   Weight: 190 lb (86.2 kg)   Height: 5\' 3"  (1.6 m)    Body mass index is 33.66 kg/m.  Wt Readings from Last 3 Encounters:  03/04/23 190 lb (86.2 kg)  01/09/23 190 lb 9.6 oz (86.5 kg)  12/08/22 186 lb (84.4 kg)    BP Readings from Last 3 Encounters:  03/04/23 130/84  01/09/23 (!) 144/86  12/08/22 120/80     Objective:   Physical Exam Vitals and nursing note reviewed.  Constitutional:      Appearance: Normal appearance.  HENT:     Head: Normocephalic and atraumatic.  Eyes:     Extraocular Movements: Extraocular movements intact.  Cardiovascular:     Rate and Rhythm: Normal rate and regular rhythm.     Heart sounds: Normal heart sounds.  Pulmonary:     Effort: Pulmonary effort is normal.     Breath sounds: Normal breath sounds.  Musculoskeletal:     Cervical back: Normal range of motion.  Skin:    General: Skin is warm.  Neurological:     General: No focal deficit present.     Mental Status: She is alert.  Psychiatric:        Mood and Affect: Mood normal.        Behavior: Behavior normal.         Assessment And Plan:  Essential hypertension, benign Assessment & Plan: Chronic, fair control. Goal BP<120/80. She is encouraged to follow low sodium diet and aim for at least 150 minutes of exercise/week. She will continue with amlodipine 2.5mg  daily. Will recheck in 3 months. If elevated, will need to increase amlodipine to 5mg  daily.    Pure hypercholesterolemia Assessment & Plan: Chronic, I will check LFTs today. If normal, I suggest that she resume atorvastatin with M-F dosing only. If restarted, will consider checking LFTs within four weeks of her starting the medication.  Orders: -     [id number]+EGFR  Elevated liver enzymes Assessment & Plan: I will check CMP today. If still elevated, will avoid resuming statin therapy at this time.   Orders: -     [id number]+EGFR  Other abnormal glucose Assessment & Plan: Previous labs reviewed, her A1c has been elevated in the past. I will check an A1c today. Reminded to avoid refined sugars including sugary drinks/foods and processed meats including bacon, sausages and deli meats.    Orders: -     Hemoglobin A1c  Class 1 obesity due to excess calories with serious comorbidity and body mass index (BMI) of 33.0 to 33.9 in adult Assessment & Plan: She  is encouraged to strive for BMI less than 30 to decrease cardiac risk. Advised to aim for at least 150 minutes of exercise per week.       Return if symptoms worsen or fail to improve.  Patient was given opportunity to ask questions. Patient verbalized understanding of the plan and was able to  repeat key elements of the plan. All questions were answered to their satisfaction.    I, [name], MD, have reviewed all documentation for this visit. The documentation on 03/04/23 for the exam, diagnosis, procedures, and orders are all accurate and complete.   IF YOU HAVE BEEN REFERRED TO A SPECIALIST, IT MAY TAKE 1-2 WEEKS TO SCHEDULE/PROCESS THE REFERRAL. IF YOU HAVE NOT HEARD FROM US/SPECIALIST IN TWO WEEKS, PLEASE GIVE [place] A CALL AT [phone].   THE PATIENT IS ENCOURAGED TO PRACTICE SOCIAL DISTANCING DUE TO THE COVID-19 PANDEMIC.

## 2023-03-05 LAB — CMP14+EGFR
ALT: 26 IU/L (ref 0–32)
AST: 20 IU/L (ref 0–40)
Albumin: 4.2 g/dL (ref 3.9–4.9)
Alkaline Phosphatase: 97 IU/L (ref 44–121)
BUN/Creatinine Ratio: 15 (ref 12–28)
BUN: 11 mg/dL (ref 8–27)
Bilirubin Total: 0.2 mg/dL (ref 0.0–1.2)
CO2: 26 mmol/L (ref 20–29)
Calcium: 9.5 mg/dL (ref 8.7–10.3)
Chloride: 101 mmol/L (ref 96–106)
Creatinine, Ser: 0.73 mg/dL (ref 0.57–1.00)
Globulin, Total: 3 g/dL (ref 1.5–4.5)
Glucose: 85 mg/dL (ref 70–99)
Potassium: 4.5 mmol/L (ref 3.5–5.2)
Sodium: 139 mmol/L (ref 134–144)
Total Protein: 7.2 g/dL (ref 6.0–8.5)
eGFR: 91 mL/min/{1.73_m2} (ref >59)

## 2023-03-05 LAB — HEMOGLOBIN A1C
Est. average glucose Bld gHb Est-mCnc: 120 mg/dL
Hgb A1c MFr Bld: 5.8 % — ABNORMAL HIGH (ref 4.8–5.6)

## 2023-03-21 DIAGNOSIS — I10 Essential (primary) hypertension: Secondary | ICD-10-CM | POA: Insufficient documentation

## 2023-03-21 DIAGNOSIS — R748 Abnormal levels of other serum enzymes: Secondary | ICD-10-CM | POA: Insufficient documentation

## 2023-03-21 DIAGNOSIS — R7309 Other abnormal glucose: Secondary | ICD-10-CM | POA: Insufficient documentation

## 2023-03-21 NOTE — Assessment & Plan Note (Signed)
Chronic, I will check LFTs today. If normal, I suggest that she resume atorvastatin with M-F dosing only. If restarted, will consider checking LFTs within four weeks of her starting the medication.

## 2023-03-21 NOTE — Assessment & Plan Note (Signed)
She is encouraged to strive for BMI less than 30 to decrease cardiac risk. Advised to aim for at least 150 minutes of exercise per week.  

## 2023-03-21 NOTE — Assessment & Plan Note (Signed)
Chronic, fair control. Goal BP<120/80. She is encouraged to follow low sodium diet and aim for at least 150 minutes of exercise/week. She will continue with amlodipine 2.5mg  daily. Will recheck in 3 months. If elevated, will need to increase amlodipine to 5mg  daily.

## 2023-03-21 NOTE — Assessment & Plan Note (Signed)
Previous labs reviewed, her A1c has been elevated in the past. I will check an A1c today. Reminded to avoid refined sugars including sugary drinks/foods and processed meats including bacon, sausages and deli meats.  

## 2023-03-21 NOTE — Assessment & Plan Note (Signed)
I will check CMP today. If still elevated, will avoid resuming statin therapy at this time.

## 2023-05-31 IMAGING — US US ABDOMEN LIMITED
1 series · 14 of 25 positions shown · non-contrast
Comparison: None Available.

CLINICAL DATA: Persistently elevated LFTs

EXAM:
ULTRASOUND ABDOMEN LIMITED RIGHT UPPER QUADRANT

[Series 1: us abdomen limited · 0.14mm/px · 14 of 46 slices shown]
[im 1/46]
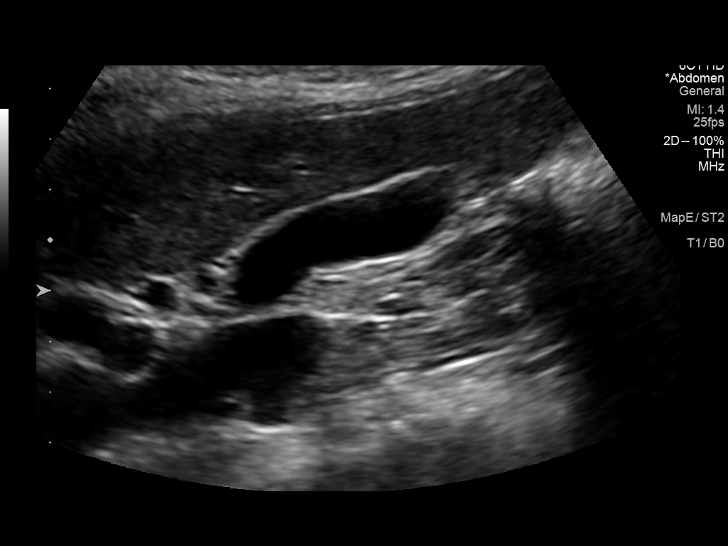
[im 4/46]
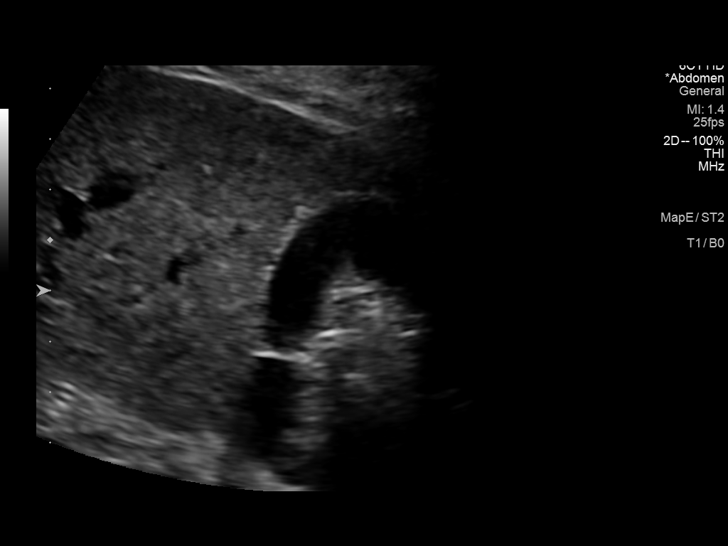
[im 8/46]
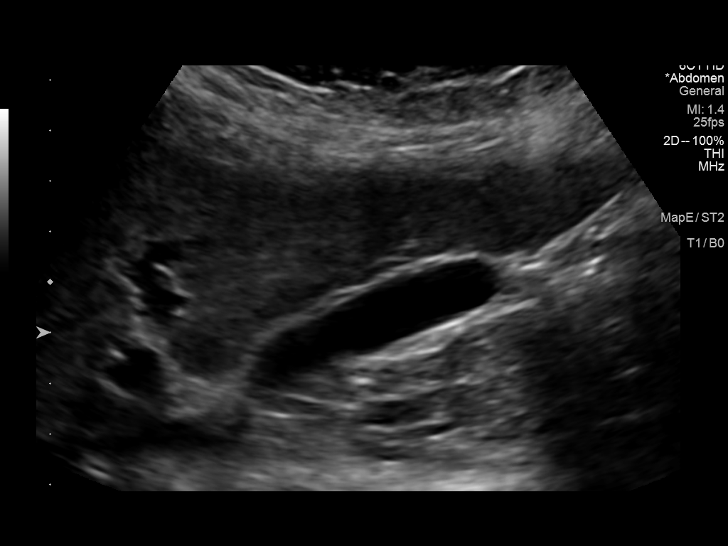
[im 12/46]
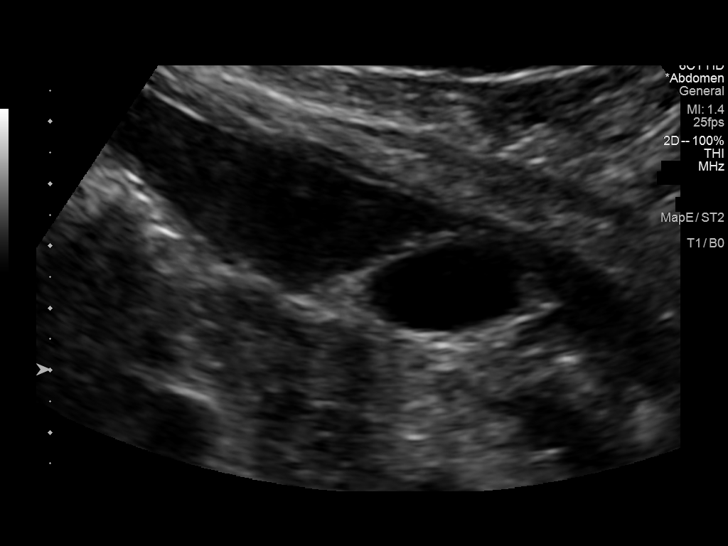
[im 16/46]
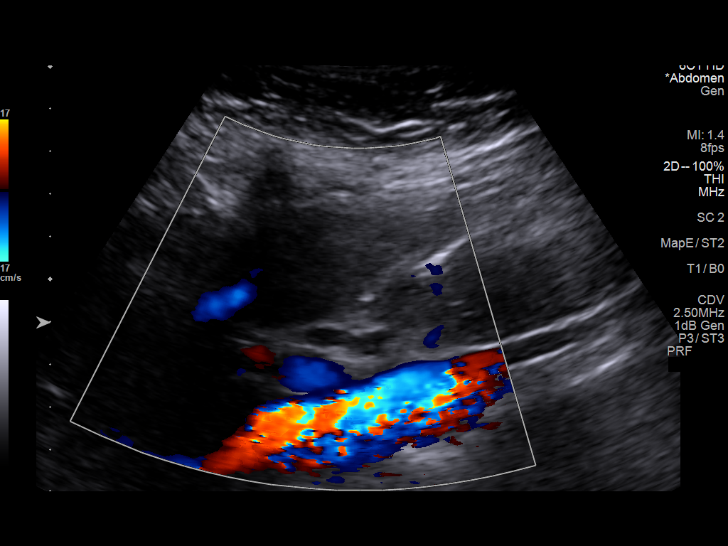
[im 17/46]
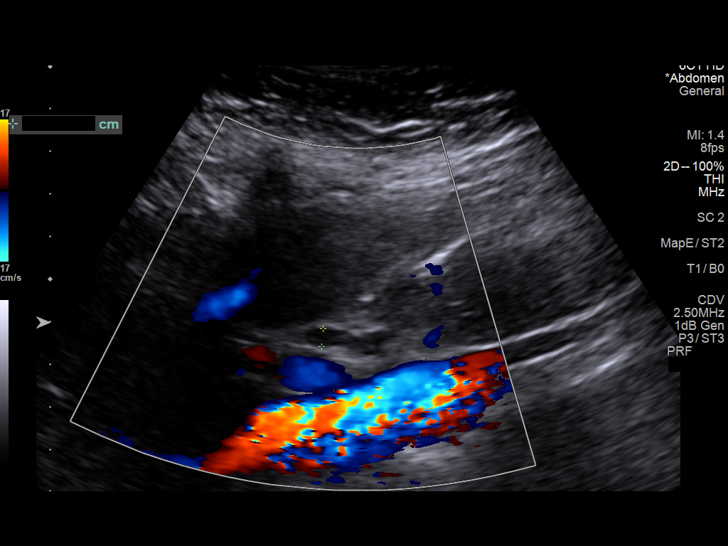
[im 21/46]
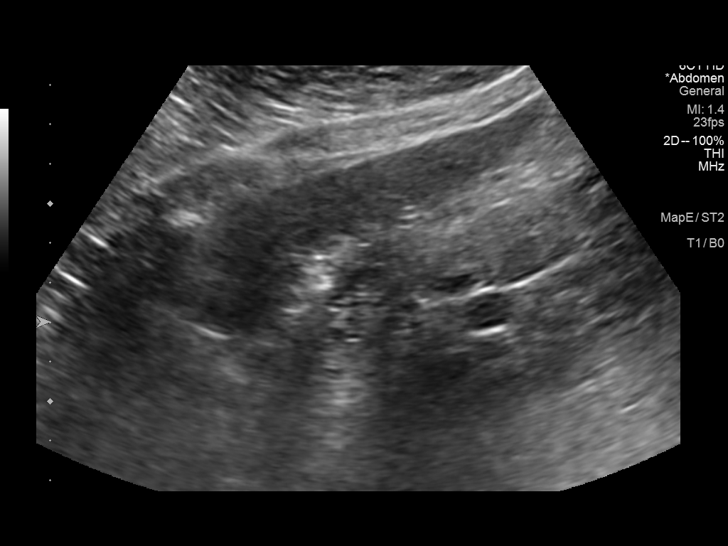
[im 25/46]
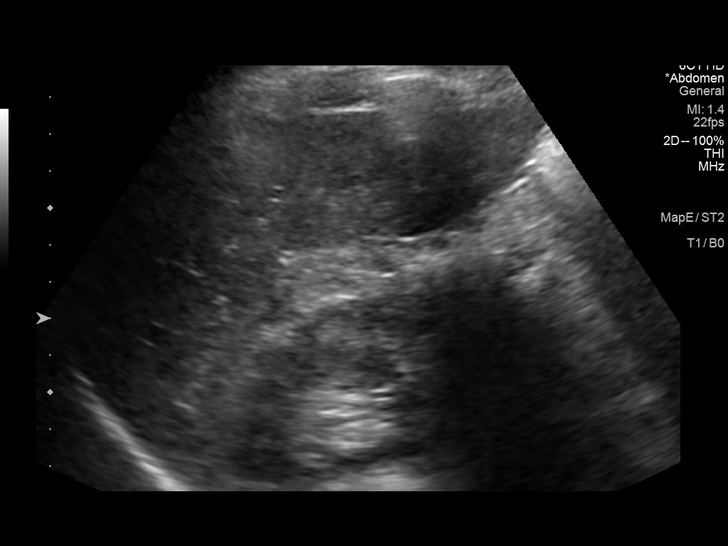
[im 29/46]
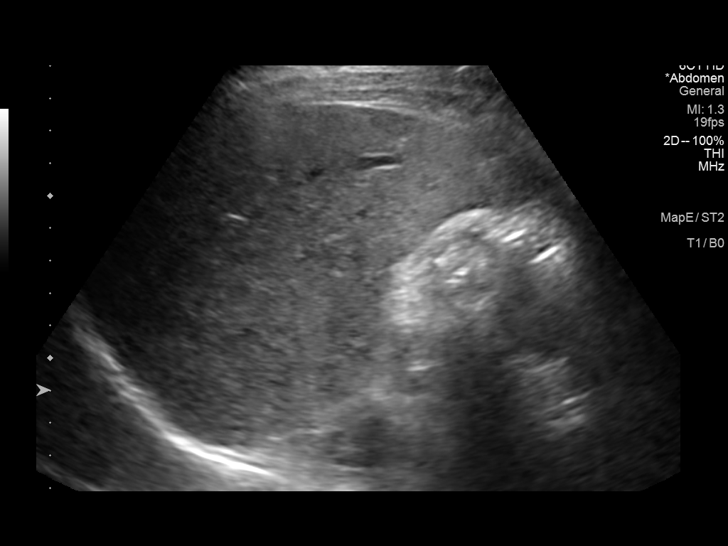
[im 31/46]
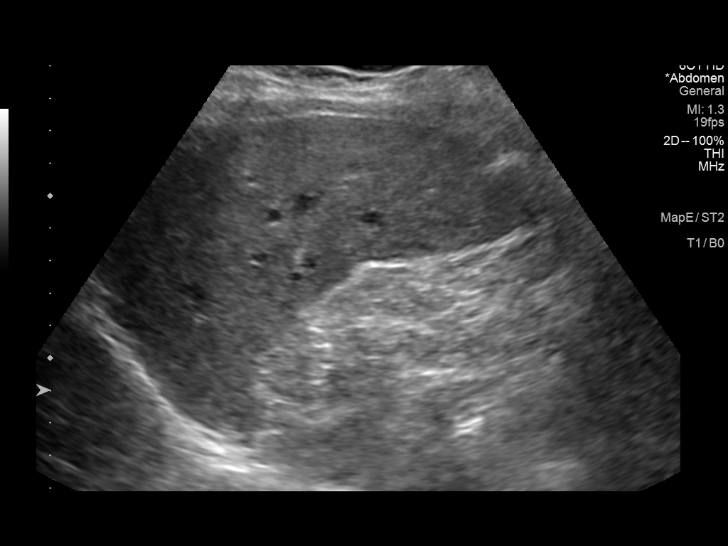
[im 34/46]
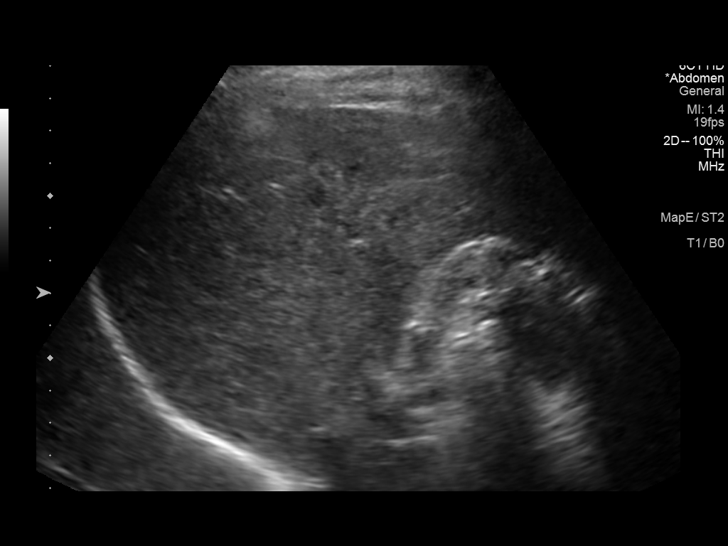
[im 38/46]
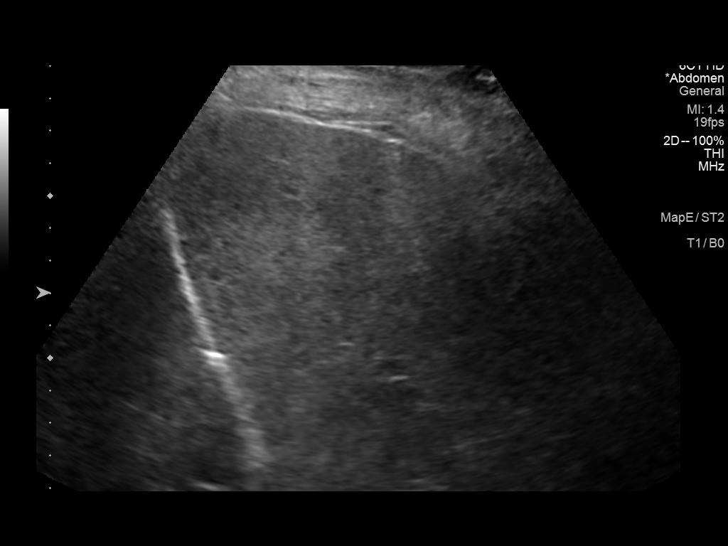
[im 42/46]
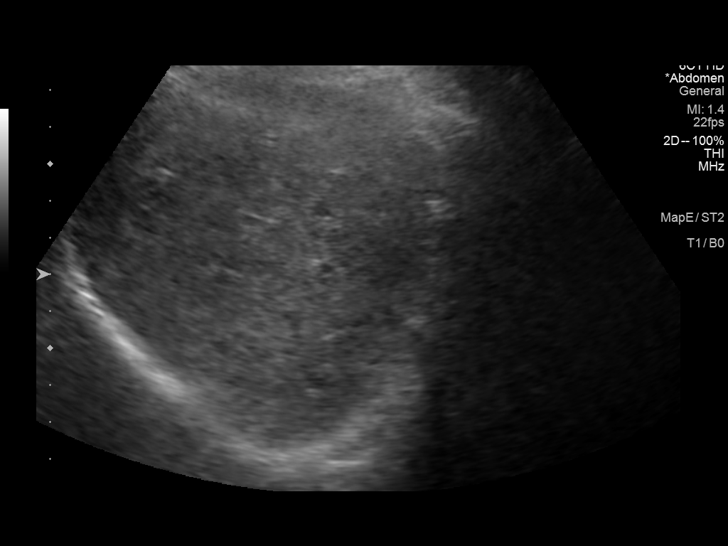
[im 46/46]
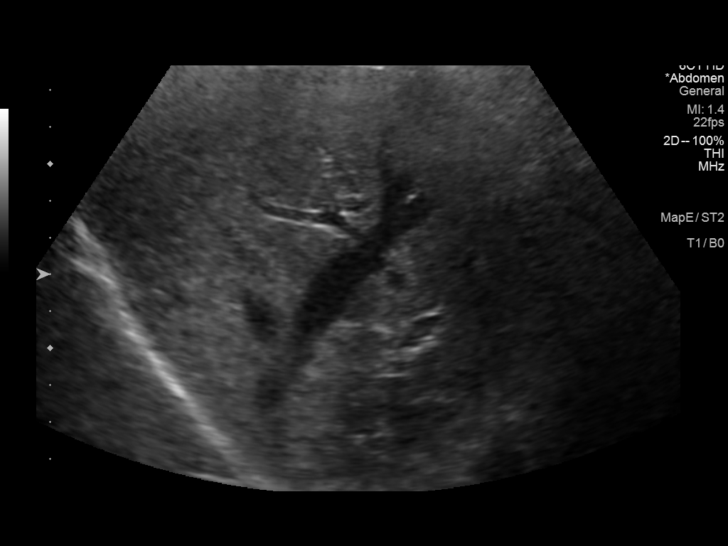

[14 of 25 positions shown; findings below may reference images not displayed]

FINDINGS: Gallbladder:

No gallstones or wall thickening visualized. No sonographic Murphy
sign noted by sonographer.

Common bile duct:

Diameter: 4 mm

Liver:

Increased echogenicity. No focal lesion. Portal vein is patent on
color Doppler imaging with normal direction of blood flow towards
the liver.

Other: None.
IMPRESSION: Increased hepatic parenchymal echogenicity suggestive of steatosis.

No cholelithiasis or sonographic evidence for acute cholecystitis.

## 2023-06-01 ENCOUNTER — Encounter: Payer: Self-pay | Admitting: Internal Medicine

## 2023-06-01 ENCOUNTER — Ambulatory Visit (INDEPENDENT_AMBULATORY_CARE_PROVIDER_SITE_OTHER): Payer: Medicare HMO | Admitting: Internal Medicine

## 2023-06-01 VITALS — BP 124/80 | HR 88 | Temp 97.8°F | Ht 63.0 in | Wt 192.0 lb

## 2023-06-01 DIAGNOSIS — E7800 Pure hypercholesterolemia, unspecified: Secondary | ICD-10-CM

## 2023-06-01 DIAGNOSIS — Z0000 Encounter for general adult medical examination without abnormal findings: Secondary | ICD-10-CM | POA: Diagnosis not present

## 2023-06-01 DIAGNOSIS — E6609 Other obesity due to excess calories: Secondary | ICD-10-CM

## 2023-06-01 DIAGNOSIS — R7309 Other abnormal glucose: Secondary | ICD-10-CM

## 2023-06-01 DIAGNOSIS — I10 Essential (primary) hypertension: Secondary | ICD-10-CM

## 2023-06-01 DIAGNOSIS — Z6834 Body mass index (BMI) 34.0-34.9, adult: Secondary | ICD-10-CM

## 2023-06-01 DIAGNOSIS — E66811 Obesity, class 1: Secondary | ICD-10-CM

## 2023-06-01 LAB — POCT URINALYSIS DIPSTICK
Bilirubin, UA: NEGATIVE
Glucose, UA: NEGATIVE
Ketones, UA: NEGATIVE
Nitrite, UA: NEGATIVE
Protein, UA: NEGATIVE
Spec Grav, UA: 1.02 (ref 1.010–1.025)
Urobilinogen, UA: 0.2 U/dL
pH, UA: 7 (ref 5.0–8.0)

## 2023-06-01 NOTE — Assessment & Plan Note (Signed)
Chronic, she will continue with atorvastatin M-F.

## 2023-06-01 NOTE — Assessment & Plan Note (Signed)
Previous labs reviewed, her A1c has been elevated in the past. I will check an A1c today. Reminded to avoid refined sugars including sugary drinks/foods and processed meats including bacon, sausages and deli meats.  

## 2023-06-01 NOTE — Progress Notes (Signed)
Subjective:    Maria Kline is a 67 y.o. female who presents for a Welcome to Medicare exam.   She reports compliance with meds. She denies having any chest pain, shortness of breath and headaches. She has no specific concerns at this time.   Hypertension This is a new problem. The current episode started more than 1 month ago. The problem has been gradually improving since onset. Pertinent negatives include no blurred vision, chest pain, palpitations or shortness of breath. Risk factors for coronary artery disease include post-menopausal state, obesity and dyslipidemia. Past treatments include calcium channel blockers.     Cardiac Risk Factors include: advanced age (>3men, >51 women);dyslipidemia;hypertension;obesity (BMI >30kg/m2)  Review of Systems  Constitutional: Negative.   HENT: Negative.    Eyes: Negative.  Negative for blurred vision.  Respiratory: Negative.  Negative for shortness of breath.   Cardiovascular: Negative.  Negative for chest pain and palpitations.  Gastrointestinal: Negative.   Genitourinary: Negative.   Musculoskeletal: Negative.   Skin: Negative.   Neurological: Negative.   Endo/Heme/Allergies: Negative.   Psychiatric/Behavioral: Negative.           Objective:    Today's Vitals   06/01/23 1112  BP: 124/80  Pulse: 88  Temp: 97.8 F (36.6 C)  SpO2: 98%  Weight: 192 lb (87.1 kg)  Height: 5\' 3"  (1.6 m)  Body mass index is 34.01 kg/m.  Medications Outpatient Encounter Medications as of 06/01/2023  Medication Sig   albuterol (VENTOLIN HFA) 108 (90 Base) MCG/ACT inhaler TAKE 2 PUFFS BY MOUTH EVERY 6 HOURS AS NEEDED FOR WHEEZE OR SHORTNESS OF BREATH   amLODipine (NORVASC) 2.5 MG tablet Take 1 tablet (2.5 mg total) by mouth daily.   atorvastatin (LIPITOR) 40 MG tablet TAKE 1 TABLET BY MOUTH EVERY DAY   cetirizine (ZYRTEC) 10 MG tablet Take 10 mg by mouth as needed.   Cholecalciferol (VITAMIN D3) 125 MCG (5000 UT) CAPS Take by mouth.    LUMIGAN 0.01 % SOLN Place 1 drop into both eyes at bedtime.   MAGNESIUM PO Take 25 mg by mouth daily at 12 noon.   Mometasone Furoate (ASMANEX HFA) 100 MCG/ACT AERO Inhale into the lungs as needed.   montelukast (SINGULAIR) 10 MG tablet TAKE 1 TABLET BY MOUTH EVERY DAY   HYDROcodone bit-homatropine (HYDROMET) 5-1.5 MG/5ML syrup Take 5 mLs by mouth every 6 (six) hours as needed for cough. (Patient not taking: Reported on 03/04/2023)   No facility-administered encounter medications on file as of 06/01/2023.     History: Past Medical History:  Diagnosis Date   Allergy    Anemia    13yrs ago   Asthma    Cataracts, bilateral    Chicken pox    Complication of anesthesia 2000   c-section; excessive shivering   Eczema    hx of   Hx of seasonal allergies    Hyperlipidemia    Measles    Mumps    Vitamin D deficiency    Vit D on tues and fri   Past Surgical History:  Procedure Laterality Date   adenodectomy     CAPSULOTOMY  03/10/2012   Procedure: MINOR CAPSULOTOMY;  Surgeon: Vita Erm., MD;  Location: [facility] OR;  Service: Ophthalmology;  Laterality: Left;  Left Eye   CATARACT EXTRACTION W/PHACO  11/25/2011   Procedure: CATARACT EXTRACTION PHACO AND INTRAOCULAR LENS PLACEMENT (IOC);  Surgeon: Shade Flood, MD;  Location: [facility] OR;  Service: Ophthalmology;  Laterality: Left;   CATARACT  EXTRACTION W/PHACO  12/07/2011   Procedure: CATARACT EXTRACTION PHACO AND INTRAOCULAR LENS PLACEMENT (IOC);  Surgeon: Shade Flood, MD;  Location: [facility] OR;  Service: Ophthalmology;  Laterality: Right;   cataract surgery      left   CESAREAN SECTION  2000   COLONOSCOPY     EYE SURGERY     MYOMECTOMY  1996   REFRACTIVE SURGERY Bilateral 02/2018   for glaucoma, performed by Dr. Harlon Flor   TONSILLECTOMY AND ADENOIDECTOMY  1964   as a child   WISDOM TOOTH EXTRACTION  1970    Family History  Problem Relation Age of Onset   Hypotension Mother    Hypertension Mother    Diabetes Mother    Dementia  Mother    GI problems Mother    Colon cancer Father    Cancer - Colon Father    Cancer Father    Hypertension Sister    Hypertension Paternal Grandmother    Hypertension Paternal Grandfather    Healthy Son    Diabetes Other    Stroke Other    Hypertension Other    Heart disease Other    Hypertension Maternal Grandmother    Asthma Son    Anesthesia problems Neg Hx    Malignant hyperthermia Neg Hx    Pseudochol deficiency Neg Hx    Sleep apnea Neg Hx    Social History   Occupational History   Not on file  Tobacco Use   Smoking status: Never    Passive exposure: Past   Smokeless tobacco: Never  Vaping Use   Vaping status: Never Used  Substance and Sexual Activity   Alcohol use: Not Currently   Drug use: No   Sexual activity: Yes    Birth control/protection: Pill, Post-menopausal    Tobacco Counseling Counseling given: Yes   Immunizations and Health Maintenance Immunization History  Administered Date(s) Administered   DTaP 09/17/1994   Hepatitis A 09/17/1994, 10/28/2009   IPV 09/17/1994   Influenza,inj,Quad PF,6+ Mos 04/12/2018, 04/18/2019, 04/10/2021   Influenza-Unspecified 04/05/2020, 03/28/2022   PFIZER(Purple Top)SARS-COV-2 Vaccination 09/02/2019, 09/23/2019, 04/05/2020   Pfizer Covid-19 Vaccine Bivalent Booster 37yrs & up 11/08/2021, 03/28/2022, 03/14/2023   Pneumococcal Conjugate-13 11/28/2013   Pneumococcal Polysaccharide-23 05/01/2020   Td 10/03/2009   Tdap 09/26/2009, 10/03/2009, 12/06/2019   Typhoid Inactivated 09/17/1994, 10/28/2009   Zoster Recombinant(Shingrix) 05/18/2021, 09/03/2021   Health Maintenance Due  Topic Date Due   COVID-19 Vaccine (7 - 2023-24 season) 05/09/2023   MAMMOGRAM  05/15/2023    Activities of Daily Living    06/01/2023   11:16 AM  In your present state of health, do you have any difficulty performing the following activities:  Hearing? 0  Vision? 0  Difficulty concentrating or making decisions? 0  Walking or  climbing stairs? 0  Dressing or bathing? 0  Doing errands, shopping? 0  Preparing Food and eating ? N  Using the Toilet? N  In the past six months, have you accidently leaked urine? N  Do you have problems with loss of bowel control? N  Managing your Medications? N  Managing your Finances? N  Housekeeping or managing your Housekeeping? N    Physical Exam   Physical Exam Vitals and nursing note reviewed.  Constitutional:      Appearance: Normal appearance. She is obese.  HENT:     Head: Normocephalic and atraumatic.     Right Ear: Tympanic membrane, ear canal and external ear normal.     Left Ear: Tympanic membrane, ear canal and  external ear normal.  Eyes:     Extraocular Movements: Extraocular movements intact.     Conjunctiva/sclera: Conjunctivae normal.     Pupils: Pupils are equal, round, and reactive to light.  Cardiovascular:     Rate and Rhythm: Normal rate and regular rhythm.     Pulses: Normal pulses.          Dorsalis pedis pulses are 2+ on the right side and 2+ on the left side.     Heart sounds: Normal heart sounds.  Pulmonary:     Effort: Pulmonary effort is normal.     Breath sounds: Normal breath sounds.  Chest:  Breasts:    Tanner Score is 5.     Right: Normal.     Left: Normal.  Abdominal:     General: Bowel sounds are normal.     Palpations: Abdomen is soft.  Musculoskeletal:        General: Normal range of motion.     Cervical back: Normal range of motion.  Skin:    General: Skin is warm and dry.  Neurological:     General: No focal deficit present.     Mental Status: She is alert and oriented to person, place, and time.  Psychiatric:        Mood and Affect: Mood normal.        Behavior: Behavior normal.      Advanced Directives: Does Patient Have a Medical Advance Directive?: Yes Type of Advance Directive: Living will Does patient want to make changes to medical advance directive?: Yes (MAU/Ambulatory/Procedural Areas - Information  given)  EKG:  HR: 64  Sinus Rhythm  Low voltage in precordial leads.  -Poor R-wave progression -may be secondary to pulmonary disease  consider old anterior infarct.  -Nonspecific T-abnormality.        Assessment:    This is a routine wellness examination for this patient .   Vision/Hearing screen Hearing Screening   500Hz  1000Hz  2000Hz  4000Hz   Right ear Pass Pass Pass Pass  Left ear Pass Pass Pass Pass     Goals       To Get Below 170 (pt-stated)      She is implementing more exercise. Exercising 4 days a week.        Depression Screen    06/01/2023   11:12 AM 03/04/2023    2:16 PM 01/09/2023   11:25 AM 01/09/2023   10:59 AM  PHQ 2/9 Scores  PHQ - 2 Score 0 0 0 0  PHQ- 9 Score 2 0       Fall Risk    06/01/2023   11:21 AM  Fall Risk   Falls in the past year? 1    Cognitive Function:        06/01/2023   11:21 AM  6CIT Screen  What Year? 0 points  What month? 0 points  What time? 0 points  Count back from 20 0 points  Months in reverse 0 points  Repeat phrase 2 points  Total Score 2 points    Patient Care Team: Dorothyann Peng, MD as PCP - General (Internal Medicine)     Plan:   Encounter for Medicare annual wellness exam Assessment & Plan: The annual wellness visit was performed including discussion of advanced directives, assessment of functional status and cognitive function. EKG performed, results included in body of note.  A full exam was also performed. She will rto in one year for AWV with [facility] Advisor.   Importance of  monthly self breast exams was discussed with the patient.  She is advised to get 3045 minutes of regular exercise, no less than four to five days per week. Both weight-bearing and aerobic exercises are recommended.  She is advised to follow a healthy diet with at least six fruits/veggies per day, decrease intake of red meat and other saturated fats and to increase fish intake to twice weekly.  Meats/fish should not be fried --  baked, boiled or broiled is preferable. It is also important to cut back on your sugar intake.  Be sure to read labels - try to avoid anything with added sugar, high fructose corn syrup or other sweeteners.  If you must use a sweetener, you can try stevia or monkfruit.  It is also important to avoid artificially sweetened foods/beverages and diet drinks. Lastly, wear SPF 50 sunscreen on exposed skin and when in direct sunlight for an extended period of time.  Be sure to avoid fast food restaurants and aim for at least 60 ounces of water daily.         Essential hypertension, benign Assessment & Plan: Chronic, fair control. Goal BP<120/80. She is encouraged to follow low sodium diet and aim for at least 150 minutes of exercise/week. She will continue with amlodipine 2.5mg  daily. Will recheck in 3-4 months. BP Readings from Last 3 Encounters:  06/01/23 124/80  03/04/23 130/84  01/09/23 (!) 144/86     Orders: -     POCT urinalysis dipstick -     Microalbumin / creatinine urine ratio -     EKG 12-Lead -     CBC -     BMP8+eGFR  Pure hypercholesterolemia Assessment & Plan: Chronic, she will continue with atorvastatin M-F.   Orders: -     TSH  Other abnormal glucose Assessment & Plan: Previous labs reviewed, her A1c has been elevated in the past. I will check an A1c today. Reminded to avoid refined sugars including sugary drinks/foods and processed meats including bacon, sausages and deli meats.    Orders: -     Hemoglobin A1c -     BMP8+eGFR  Class 1 obesity due to excess calories with serious comorbidity and body mass index (BMI) of 34.0 to 34.9 in adult Assessment & Plan: She is encouraged to strive for BMI less than 30 to decrease cardiac risk. Advised to aim for at least 150 minutes of exercise per week.       I have personally reviewed and noted the following in the patient's chart:   Medical and social history Use of alcohol, tobacco or illicit drugs  Current  medications and supplements Functional ability and status Nutritional status Physical activity Advanced directives List of other physicians Hospitalizations, surgeries, and ER visits in previous 12 months Vitals Screenings to include cognitive, depression, and falls Referrals and appointments  In addition, I have reviewed and discussed with patient certain preventive protocols, quality metrics, and best practice recommendations. A written personalized care plan for preventive services as well as general preventive health recommendations were provided to patient.     [name], MD 06/09/2023

## 2023-06-01 NOTE — Patient Instructions (Addendum)
  Maria Kline , Thank you for taking time to come for your Medicare Wellness Visit. I appreciate your ongoing commitment to your health goals. Please review the following plan we discussed and let me know if I can assist you in the future.   These are the goals we discussed:  Goals       To Get Below 170 (pt-stated)      She is implementing more exercise. Exercising 4 days a week.         This is a list of the screening recommended for you and due dates:  Health Maintenance  Topic Date Due   COVID-19 Vaccine (7 - 2023-24 season) 05/09/2023   Mammogram  05/15/2023   Flu Shot  09/27/2023*   HIV Screening  05/31/2024*   DEXA scan (bone density measurement)  06/12/2023   Medicare Annual Wellness Visit  05/31/2024   Colon Cancer Screening  03/05/2025   Pneumonia Vaccine (3 of 3 - PPSV23 or PCV20) 05/01/2025   Pap with HPV screening  05/15/2026   DTaP/Tdap/Td vaccine (6 - Td or Tdap) 12/05/2029   Hepatitis C Screening  Completed   Zoster (Shingles) Vaccine  Completed   HPV Vaccine  Aged Out  *Topic was postponed. The date shown is not the original due date.

## 2023-06-02 LAB — MICROALBUMIN / CREATININE URINE RATIO
Creatinine, Urine: 45.5 mg/dL
Microalb/Creat Ratio: 8 mg/g{creat} (ref 0–29)
Microalbumin, Urine: 3.8 ug/mL

## 2023-06-02 LAB — CBC
Hematocrit: 39.7 % (ref 34.0–46.6)
Hemoglobin: 12.9 g/dL (ref 11.1–15.9)
MCH: 28.7 pg (ref 26.6–33.0)
MCHC: 32.5 g/dL (ref 31.5–35.7)
MCV: 88 fL (ref 79–97)
Platelets: 270 10*3/uL (ref 150–450)
RBC: 4.5 x10E6/uL (ref 3.77–5.28)
RDW: 13.2 % (ref 11.7–15.4)
WBC: 3.6 10*3/uL (ref 3.4–10.8)

## 2023-06-02 LAB — BMP8+EGFR
BUN/Creatinine Ratio: 21 (ref 12–28)
BUN: 16 mg/dL (ref 8–27)
CO2: 26 mmol/L (ref 20–29)
Calcium: 9.4 mg/dL (ref 8.7–10.3)
Chloride: 102 mmol/L (ref 96–106)
Creatinine, Ser: 0.77 mg/dL (ref 0.57–1.00)
Glucose: 91 mg/dL (ref 70–99)
Potassium: 4.6 mmol/L (ref 3.5–5.2)
Sodium: 140 mmol/L (ref 134–144)
eGFR: 86 mL/min/{1.73_m2} (ref >59)

## 2023-06-02 LAB — TSH: TSH: 2.77 u[IU]/mL (ref 0.450–4.500)

## 2023-06-02 LAB — HEMOGLOBIN A1C
Est. average glucose Bld gHb Est-mCnc: 134 mg/dL
Hgb A1c MFr Bld: 6.3 % — ABNORMAL HIGH (ref 4.8–5.6)

## 2023-06-09 DIAGNOSIS — Z01419 Encounter for gynecological examination (general) (routine) without abnormal findings: Secondary | ICD-10-CM | POA: Diagnosis not present

## 2023-06-09 DIAGNOSIS — Z6831 Body mass index (BMI) 31.0-31.9, adult: Secondary | ICD-10-CM | POA: Diagnosis not present

## 2023-06-09 DIAGNOSIS — Z1231 Encounter for screening mammogram for malignant neoplasm of breast: Secondary | ICD-10-CM | POA: Diagnosis not present

## 2023-06-09 DIAGNOSIS — Z124 Encounter for screening for malignant neoplasm of cervix: Secondary | ICD-10-CM | POA: Diagnosis not present

## 2023-06-09 LAB — HM MAMMOGRAPHY

## 2023-06-09 NOTE — Assessment & Plan Note (Signed)
Chronic, fair control. Goal BP<120/80. She is encouraged to follow low sodium diet and aim for at least 150 minutes of exercise/week. She will continue with amlodipine 2.5mg  daily. Will recheck in 3-4 months. BP Readings from Last 3 Encounters:  06/01/23 124/80  03/04/23 130/84  01/09/23 (!) 144/86

## 2023-06-09 NOTE — Assessment & Plan Note (Signed)
The annual wellness visit was performed including discussion of advanced directives, assessment of functional status and cognitive function. EKG performed, results included in body of note.  A full exam was also performed. She will rto in one year for AWV with [facility] Advisor.   Importance of monthly self breast exams was discussed with the patient.  She is advised to get 3045 minutes of regular exercise, no less than four to five days per week. Both weight-bearing and aerobic exercises are recommended.  She is advised to follow a healthy diet with at least six fruits/veggies per day, decrease intake of red meat and other saturated fats and to increase fish intake to twice weekly.  Meats/fish should not be fried -- baked, boiled or broiled is preferable. It is also important to cut back on your sugar intake.  Be sure to read labels - try to avoid anything with added sugar, high fructose corn syrup or other sweeteners.  If you must use a sweetener, you can try stevia or monkfruit.  It is also important to avoid artificially sweetened foods/beverages and diet drinks. Lastly, wear SPF 50 sunscreen on exposed skin and when in direct sunlight for an extended period of time.  Be sure to avoid fast food restaurants and aim for at least 60 ounces of water daily.

## 2023-06-09 NOTE — Assessment & Plan Note (Signed)
 She is encouraged to strive for BMI less than 30 to decrease cardiac risk. Advised to aim for at least 150 minutes of exercise per week.

## 2023-06-14 DIAGNOSIS — E785 Hyperlipidemia, unspecified: Secondary | ICD-10-CM | POA: Diagnosis not present

## 2023-06-14 DIAGNOSIS — Z6833 Body mass index (BMI) 33.0-33.9, adult: Secondary | ICD-10-CM | POA: Diagnosis not present

## 2023-06-14 DIAGNOSIS — Z87892 Personal history of anaphylaxis: Secondary | ICD-10-CM | POA: Diagnosis not present

## 2023-06-14 DIAGNOSIS — Z008 Encounter for other general examination: Secondary | ICD-10-CM | POA: Diagnosis not present

## 2023-06-14 DIAGNOSIS — Z885 Allergy status to narcotic agent status: Secondary | ICD-10-CM | POA: Diagnosis not present

## 2023-06-14 DIAGNOSIS — E669 Obesity, unspecified: Secondary | ICD-10-CM | POA: Diagnosis not present

## 2023-06-14 DIAGNOSIS — G4733 Obstructive sleep apnea (adult) (pediatric): Secondary | ICD-10-CM | POA: Diagnosis not present

## 2023-06-14 DIAGNOSIS — Z880 Allergy status to penicillin: Secondary | ICD-10-CM | POA: Diagnosis not present

## 2023-06-14 DIAGNOSIS — I10 Essential (primary) hypertension: Secondary | ICD-10-CM | POA: Diagnosis not present

## 2023-06-14 DIAGNOSIS — J45909 Unspecified asthma, uncomplicated: Secondary | ICD-10-CM | POA: Diagnosis not present

## 2023-06-14 DIAGNOSIS — Z91040 Latex allergy status: Secondary | ICD-10-CM | POA: Diagnosis not present

## 2023-06-14 DIAGNOSIS — Z809 Family history of malignant neoplasm, unspecified: Secondary | ICD-10-CM | POA: Diagnosis not present

## 2023-06-14 DIAGNOSIS — Z818 Family history of other mental and behavioral disorders: Secondary | ICD-10-CM | POA: Diagnosis not present

## 2023-07-08 ENCOUNTER — Inpatient Hospital Stay: Payer: Medicare HMO | Attending: Nurse Practitioner

## 2023-07-08 DIAGNOSIS — D472 Monoclonal gammopathy: Secondary | ICD-10-CM | POA: Diagnosis not present

## 2023-07-08 LAB — CBC WITH DIFFERENTIAL/PLATELET
Abs Immature Granulocytes: 0.01 10*3/uL (ref 0.00–0.07)
Basophils Absolute: 0 10*3/uL (ref 0.0–0.1)
Basophils Relative: 1 %
Eosinophils Absolute: 0.1 10*3/uL (ref 0.0–0.5)
Eosinophils Relative: 2 %
HCT: 40.4 % (ref 36.0–46.0)
Hemoglobin: 13.1 g/dL (ref 12.0–15.0)
Immature Granulocytes: 0 %
Lymphocytes Relative: 38 %
Lymphs Abs: 1.7 10*3/uL (ref 0.7–4.0)
MCH: 28.6 pg (ref 26.0–34.0)
MCHC: 32.4 g/dL (ref 30.0–36.0)
MCV: 88.2 fL (ref 80.0–100.0)
Monocytes Absolute: 0.3 10*3/uL (ref 0.1–1.0)
Monocytes Relative: 8 %
Neutro Abs: 2.2 10*3/uL (ref 1.7–7.7)
Neutrophils Relative %: 51 %
Platelets: 286 10*3/uL (ref 150–400)
RBC: 4.58 MIL/uL (ref 3.87–5.11)
RDW: 14.1 % (ref 11.5–15.5)
WBC: 4.3 10*3/uL (ref 4.0–10.5)
nRBC: 0 % (ref 0.0–0.2)

## 2023-07-08 NOTE — Progress Notes (Signed)
 Patient scheduled for visit 07/15/2023.

## 2023-07-14 ENCOUNTER — Other Ambulatory Visit: Payer: Self-pay | Admitting: Nurse Practitioner

## 2023-07-14 DIAGNOSIS — D472 Monoclonal gammopathy: Secondary | ICD-10-CM

## 2023-07-14 NOTE — Assessment & Plan Note (Deleted)
 MGUS, IgM  -This was discovered on routine lab work, M protein was positive but low level, 0.3, immunofixation was positive for IgM.  Serum IgM level was normal -No anemia, her renal function and calcium level has been normal lately.  No clinical concern for multiple myeloma -initial work up in 12/2022 showed normal kappa lambda light chains and normal urine protein and light chains.  Bone survey showed degenerative changes in multiple joints.  There were no focal lytic lesions in the bony structures.

## 2023-07-14 NOTE — Progress Notes (Deleted)
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  Serum IgM level was normal -No anemia, her renal function and calcium level has been normal lately.  No clinical concern for multiple myeloma -initial work up in 12/2022 showed normal kappa lambda light chains and normal urine protein and light chains.  Bone survey showed degenerative changes in multiple joints.  There were no focal lytic lesions in the bony structures.     The patient understands the plans discussed today and is in agreement with them.  She knows to contact our office if she develops concerns prior to her next appointment.  I provided *** minutes of face-to-face time during this encounter and > 50% was spent counseling as documented under my assessment and plan.    Carlean Jews, NP  [facility] - A DEPT OF [facility] [address] [place] [address] Dept: [phone] Dept Fax: [phone]   No orders of the defined types were placed in this encounter.     CHIEF COMPLAINT:  CC: MGUS  Current Treatment: Surveillance  INTERVAL HISTORY:  Yeily is here today for repeat clinical assessment.  Initially seen by Dr. Mosetta Putt on 01/09/2023.  Initial labs showed normal kappa lambda light chains and normal urine protein and light chains.  Bone survey showed degenerative changes in multiple joints.  There were no focal lytic lesions in the bony structures.  She denies fevers or chills. She denies pain. Her appetite is good. Her weight {Weight change:10426}.  I have reviewed the past medical history, past surgical history, social history  and family history with the patient and they are unchanged from previous note.  ALLERGIES:  is allergic to demerol [meperidine] and penicillins.  MEDICATIONS:  Current Outpatient Medications  Medication Sig Dispense Refill   albuterol (VENTOLIN HFA) 108 (90 Base) MCG/ACT inhaler TAKE 2 PUFFS BY MOUTH EVERY 6 HOURS AS NEEDED FOR WHEEZE OR SHORTNESS OF BREATH 18 each 5   amLODipine (NORVASC) 2.5 MG tablet Take 1 tablet (2.5 mg total) by mouth daily. 30 tablet 11   atorvastatin (LIPITOR) 40 MG tablet TAKE 1 TABLET BY MOUTH EVERY DAY 90 tablet 2   cetirizine (ZYRTEC) 10 MG tablet Take 10 mg by mouth as needed.     Cholecalciferol (VITAMIN D3) 125 MCG (5000 UT) CAPS Take by mouth.     HYDROcodone bit-homatropine (HYDROMET) 5-1.5 MG/5ML syrup Take 5 mLs by mouth every 6 (six) hours as needed for cough. (Patient not taking: Reported on 03/04/2023) 120 mL 0   LUMIGAN 0.01 % SOLN Place 1 drop into both eyes at bedtime.     MAGNESIUM PO Take 25 mg by mouth daily at 12 noon.     Mometasone Furoate (ASMANEX HFA) 100 MCG/ACT AERO Inhale into the lungs as needed.     montelukast (SINGULAIR) 10 MG tablet TAKE 1 TABLET BY MOUTH EVERY DAY 90 tablet 2   No current facility-administered medications for this visit.    HISTORY OF PRESENT ILLNESS:   Oncology History   No history exists.      REVIEW OF SYSTEMS:   Constitutional: Denies fevers, chills or abnormal weight  loss Eyes: Denies blurriness of vision Ears, nose, mouth, throat, and face: Denies mucositis or sore throat Respiratory: Denies cough, dyspnea or wheezes Cardiovascular: Denies palpitation, chest discomfort or lower extremity swelling Gastrointestinal:  Denies nausea, heartburn or change in bowel habits Skin: Denies abnormal skin rashes Lymphatics: Denies new lymphadenopathy or easy bruising Neurological:Denies numbness, tingling or new weaknesses Behavioral/Psych: Mood is stable, no new changes  All other systems were reviewed with the  patient and are negative.   VITALS:  Last menstrual period 09/20/2010.  Wt Readings from Last 3 Encounters:  06/01/23 192 lb (87.1 kg)  03/04/23 190 lb (86.2 kg)  01/09/23 190 lb 9.6 oz (86.5 kg)    There is no height or weight on file to calculate BMI.  Performance status (ECOG): {CHL ONC [id number]  PHYSICAL EXAM:   GENERAL:alert, no distress and comfortable SKIN: skin color, texture, turgor are normal, no rashes or significant lesions EYES: normal, Conjunctiva are pink and non-injected, sclera clear OROPHARYNX:no exudate, no erythema and lips, buccal mucosa, and tongue normal  NECK: supple, thyroid normal size, non-tender, without nodularity LYMPH:  no palpable lymphadenopathy in the cervical, axillary or inguinal LUNGS: clear to auscultation and percussion with normal breathing effort HEART: regular rate & rhythm and no murmurs and no lower extremity edema ABDOMEN:abdomen soft, non-tender and normal bowel sounds Musculoskeletal:no cyanosis of digits and no clubbing  NEURO: alert & oriented x 3 with fluent speech, no focal motor/sensory deficits  LABORATORY DATA:  I have reviewed the data as listed    Component Value Date/Time   NA 140 06/01/2023 1226   K 4.6 06/01/2023 1226   CL 102 06/01/2023 1226   CO2 26 06/01/2023 1226   GLUCOSE 91 06/01/2023 1226   BUN 16 06/01/2023 1226   CREATININE 0.77 06/01/2023 1226   CALCIUM 9.4 06/01/2023 1226   PROT 7.2 03/04/2023 1500   ALBUMIN 4.2 03/04/2023 1500   AST 20 03/04/2023 1500   ALT 26 03/04/2023 1500   ALKPHOS 97 03/04/2023 1500   BILITOT 0.2 03/04/2023 1500   GFRNONAA 90 04/25/2020 1024   GFRAA 104 04/25/2020 1024    No results found for: "SPEP", "UPEP"  Lab Results  Component Value Date   WBC 4.3 07/08/2023   NEUTROABS 2.2 07/08/2023   HGB 13.1 07/08/2023   HCT 40.4 07/08/2023   MCV 88.2 07/08/2023   PLT 286 07/08/2023      Chemistry      Component Value Date/Time   NA 140 06/01/2023 1226   K 4.6  06/01/2023 1226   CL 102 06/01/2023 1226   CO2 26 06/01/2023 1226   BUN 16 06/01/2023 1226   CREATININE 0.77 06/01/2023 1226   GLU 84 08/23/2017 0000      Component Value Date/Time   CALCIUM 9.4 06/01/2023 1226   ALKPHOS 97 03/04/2023 1500   AST 20 03/04/2023 1500   ALT 26 03/04/2023 1500   BILITOT 0.2 03/04/2023 1500       RADIOGRAPHIC STUDIES: I have personally reviewed the radiological images as listed and agreed with the findings in the report. No results found.

## 2023-07-15 ENCOUNTER — Telehealth: Payer: Self-pay | Admitting: Nurse Practitioner

## 2023-07-15 ENCOUNTER — Inpatient Hospital Stay (HOSPITAL_BASED_OUTPATIENT_CLINIC_OR_DEPARTMENT_OTHER): Payer: Medicare HMO | Admitting: Nurse Practitioner

## 2023-07-15 ENCOUNTER — Inpatient Hospital Stay: Payer: Medicare HMO

## 2023-07-15 ENCOUNTER — Inpatient Hospital Stay: Payer: Medicare HMO | Admitting: Nurse Practitioner

## 2023-07-15 VITALS — BP 138/82 | HR 81 | Temp 98.9°F | Resp 17 | Wt 193.5 lb

## 2023-07-15 DIAGNOSIS — D472 Monoclonal gammopathy: Secondary | ICD-10-CM | POA: Diagnosis not present

## 2023-07-15 LAB — CMP (CANCER CENTER ONLY)
ALT: 77 U/L — ABNORMAL HIGH (ref 0–44)
AST: 36 U/L (ref 15–41)
Albumin: 4.2 g/dL (ref 3.5–5.0)
Alkaline Phosphatase: 97 U/L (ref 38–126)
Anion gap: 5 (ref 5–15)
BUN: 20 mg/dL (ref 8–23)
CO2: 30 mmol/L (ref 22–32)
Calcium: 9.9 mg/dL (ref 8.9–10.3)
Chloride: 105 mmol/L (ref 98–111)
Creatinine: 0.73 mg/dL (ref 0.44–1.00)
GFR, Estimated: >60 mL/min (ref >60)
Glucose, Bld: 101 mg/dL — ABNORMAL HIGH (ref 70–99)
Potassium: 4.1 mmol/L (ref 3.5–5.1)
Sodium: 140 mmol/L (ref 135–145)
Total Bilirubin: 0.4 mg/dL (ref 0.0–1.2)
Total Protein: 7.6 g/dL (ref 6.5–8.1)

## 2023-07-15 LAB — CBC (CANCER CENTER ONLY)
HCT: 40.8 % (ref 36.0–46.0)
Hemoglobin: 13.5 g/dL (ref 12.0–15.0)
MCH: 28.5 pg (ref 26.0–34.0)
MCHC: 33.1 g/dL (ref 30.0–36.0)
MCV: 86.1 fL (ref 80.0–100.0)
Platelet Count: 268 10*3/uL (ref 150–400)
RBC: 4.74 MIL/uL (ref 3.87–5.11)
RDW: 13.9 % (ref 11.5–15.5)
WBC Count: 4.1 10*3/uL (ref 4.0–10.5)
nRBC: 0 % (ref 0.0–0.2)

## 2023-07-15 NOTE — Assessment & Plan Note (Signed)
MGUS, IgM  -This was discovered on routine lab work, M protein was positive but low level, 0.3, immunofixation was positive for IgM.  Serum IgM level was normal -No anemia, her renal function and calcium level has been normal lately.  No clinical concern for multiple myeloma -initial work up in 12/2022 showed normal kappa lambda light chains and normal urine protein and light chains.  Bone survey showed degenerative changes in multiple joints.  There were no focal lytic lesions in the bony structures.

## 2023-07-15 NOTE — Progress Notes (Signed)
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  Serum IgM level was normal -No anemia, her renal function and calcium level has been normal lately.  No clinical concern for multiple myeloma -initial work up in 12/2022 showed normal kappa lambda light chains and normal urine protein and light chains.  Bone survey showed degenerative changes in multiple joints.  There were no focal lytic lesions in the bony structures.     Plan: Labs reviewed  -CBC showing WBC 4.1; Hgb 13.5; Hct 40.8; Plt 268 -CMP - K 4.1; glucose 101; BUN 20; Creatinine 0.73; eGFR > 60; Ca 9.9; AST 26; ALT 77; ALKP 97 -Kappa free light chains 14.3; lambda free light chain is 11.9; kappa/lambda light chain ratio 1.25 -Multiple myeloma panel pending -Patient is asymptomatic.  Will continue to monitor. -Labs today follow-up to be done in 6 months.  The patient understands the plans discussed today and is in agreement with them.  She knows to contact our office if she develops concerns prior to her next appointment.  I provided 25 minutes of face-to-face time during this encounter and > 50% was spent counseling as documented under my assessment and plan.    Carlean Jews, NP  [facility] - A DEPT OF [facility] [address] [place] [address] Dept: [phone] Dept Fax: [phone]   No orders of the defined types were placed in this encounter.     CHIEF COMPLAINT:  CC: MGUS  Current Treatment: Surveillance  INTERVAL HISTORY:  Kyanah is here today for repeat clinical assessment.  Initially seen by Dr. Mosetta Putt on 01/09/2023.   Initial labs showed normal kappa lambda light chains and normal urine protein and light chains.  Bone survey showed degenerative changes in multiple joints.  There were no focal lytic lesions in the bony structures.  She denies chest pain, chest pressure, or shortness of breath. She denies headaches or visual disturbances. She denies abdominal pain, nausea, vomiting, or changes in bowel or bladder habits.  She denies fevers or chills. Her appetite is good. Her weight has been stable.  I have reviewed the past medical history, past surgical history, social history and family history with the patient and they are unchanged from previous note.  ALLERGIES:  is allergic to demerol [meperidine] and penicillins.  MEDICATIONS:  Current Outpatient Medications  Medication Sig Dispense Refill   amLODipine (NORVASC) 2.5 MG tablet Take 1 tablet (2.5 mg total) by mouth daily. 30 tablet 11   atorvastatin (LIPITOR) 40 MG tablet TAKE 1 TABLET BY MOUTH EVERY DAY 90 tablet 2   Cholecalciferol (VITAMIN D3) 125 MCG (5000 UT) CAPS Take by mouth.     LUMIGAN 0.01 % SOLN Place 1 drop into both eyes at bedtime.     MAGNESIUM PO Take 25 mg by mouth daily at 12 noon.     albuterol (VENTOLIN HFA) 108 (90 Base) MCG/ACT inhaler TAKE 2 PUFFS BY MOUTH EVERY 6 HOURS AS NEEDED FOR WHEEZE OR SHORTNESS OF BREATH (Patient taking differently: as needed for wheezing or shortness of breath.) 18 each 5   cetirizine (ZYRTEC) 10 MG tablet Take 10 mg  by mouth as needed for allergies or rhinitis.     Mometasone Furoate (ASMANEX HFA) 100 MCG/ACT AERO Inhale into the lungs as needed.     montelukast (SINGULAIR) 10 MG tablet TAKE 1 TABLET BY MOUTH EVERY DAY (Patient taking differently: Take 10 mg by mouth as needed.) 90 tablet 2   No current facility-administered medications for this visit.     REVIEW OF SYSTEMS:   Constitutional: Denies fevers, chills or abnormal weight loss Eyes: Denies blurriness of vision Ears, nose, mouth, throat,  and face: Denies mucositis or sore throat Respiratory: Denies cough, dyspnea or wheezes Cardiovascular: Denies palpitation, chest discomfort or lower extremity swelling Gastrointestinal:  Denies nausea, heartburn or change in bowel habits Skin: Denies abnormal skin rashes Lymphatics: Denies new lymphadenopathy or easy bruising Neurological:Denies numbness, tingling or new weaknesses Behavioral/Psych: Mood is stable, no new changes  All other systems were reviewed with the patient and are negative.   VITALS:   Today's Vitals   07/15/23 1252 07/15/23 1256  BP: (!) 165/89 138/82  Pulse: 81   Resp: 17   Temp: 98.9 F (37.2 C)   TempSrc: Temporal   SpO2: 94%   Weight: 193 lb 8 oz (87.8 kg)   PainSc:  4    Body mass index is 34.28 kg/m.   Wt Readings from Last 3 Encounters:  07/15/23 193 lb 8 oz (87.8 kg)  06/01/23 192 lb (87.1 kg)  03/04/23 190 lb (86.2 kg)    Body mass index is 34.28 kg/m.  Performance status (ECOG): 0 - Asymptomatic  PHYSICAL EXAM:   GENERAL:alert, no distress and comfortable SKIN: skin color, texture, turgor are normal, no rashes or significant lesions EYES: normal, Conjunctiva are pink and non-injected, sclera clear OROPHARYNX:no exudate, no erythema and lips, buccal mucosa, and tongue normal  NECK: supple, thyroid normal size, non-tender, without nodularity LYMPH:  no palpable lymphadenopathy in the cervical, axillary or inguinal LUNGS: clear to auscultation and percussion with normal breathing effort HEART: regular rate & rhythm and no murmurs and no lower extremity edema ABDOMEN:abdomen soft, non-tender and normal bowel sounds Musculoskeletal:no cyanosis of digits and no clubbing  NEURO: alert & oriented x 3 with fluent speech, no focal motor/sensory deficits  LABORATORY DATA:  I have reviewed the data as listed    Component Value Date/Time   NA 140 07/15/2023 1224   NA 140 06/01/2023 1226   K 4.1 07/15/2023 1224   CL 105 07/15/2023 1224    CO2 30 07/15/2023 1224   GLUCOSE 101 (H) 07/15/2023 1224   BUN 20 07/15/2023 1224   BUN 16 06/01/2023 1226   CREATININE 0.73 07/15/2023 1224   CALCIUM 9.9 07/15/2023 1224   PROT 7.6 07/15/2023 1224   PROT 7.2 03/04/2023 1500   ALBUMIN 4.2 07/15/2023 1224   ALBUMIN 4.2 03/04/2023 1500   AST 36 07/15/2023 1224   ALT 77 (H) 07/15/2023 1224   ALKPHOS 97 07/15/2023 1224   BILITOT 0.4 07/15/2023 1224   GFRNONAA >60 07/15/2023 1224   GFRAA 104 04/25/2020 1024    Lab Results  Component Value Date   WBC 4.1 07/15/2023   NEUTROABS 2.2 07/08/2023   HGB 13.5 07/15/2023   HCT 40.8 07/15/2023   MCV 86.1 07/15/2023   PLT 268 07/15/2023

## 2023-07-15 NOTE — Telephone Encounter (Deleted)
TC to Pt to inquire about missed appointment. Left message to reschedule appointment.

## 2023-07-16 LAB — KAPPA/LAMBDA LIGHT CHAINS
Kappa free light chain: 14.3 mg/L (ref 3.3–19.4)
Kappa, lambda light chain ratio: 1.25 (ref 0.26–1.65)
Lambda free light chains: 11.4 mg/L (ref 5.7–26.3)

## 2023-07-18 ENCOUNTER — Encounter: Payer: Self-pay | Admitting: Nurse Practitioner

## 2023-07-22 DIAGNOSIS — H33192 Other retinoschisis and retinal cysts, left eye: Secondary | ICD-10-CM | POA: Diagnosis not present

## 2023-07-22 DIAGNOSIS — H43821 Vitreomacular adhesion, right eye: Secondary | ICD-10-CM | POA: Diagnosis not present

## 2023-07-22 DIAGNOSIS — H33191 Other retinoschisis and retinal cysts, right eye: Secondary | ICD-10-CM | POA: Diagnosis not present

## 2023-07-22 DIAGNOSIS — H35371 Puckering of macula, right eye: Secondary | ICD-10-CM | POA: Diagnosis not present

## 2023-07-22 DIAGNOSIS — H401131 Primary open-angle glaucoma, bilateral, mild stage: Secondary | ICD-10-CM | POA: Diagnosis not present

## 2023-07-22 LAB — MULTIPLE MYELOMA PANEL, SERUM
Albumin SerPl Elph-Mcnc: 3.4 g/dL (ref 2.9–4.4)
Albumin/Glob SerPl: 1 (ref 0.7–1.7)
Alpha 1: 0.3 g/dL (ref 0.0–0.4)
Alpha2 Glob SerPl Elph-Mcnc: 0.7 g/dL (ref 0.4–1.0)
B-Globulin SerPl Elph-Mcnc: 1.3 g/dL (ref 0.7–1.3)
Gamma Glob SerPl Elph-Mcnc: 1.3 g/dL (ref 0.4–1.8)
Globulin, Total: 3.5 g/dL (ref 2.2–3.9)
IgA: 180 mg/dL (ref 87–352)
IgG (Immunoglobin G), Serum: 1209 mg/dL (ref 586–1602)
IgM (Immunoglobulin M), Srm: 181 mg/dL (ref 26–217)
M Protein SerPl Elph-Mcnc: 0.4 g/dL — ABNORMAL HIGH
Total Protein ELP: 6.9 g/dL (ref 6.0–8.5)

## 2023-07-28 DIAGNOSIS — H35371 Puckering of macula, right eye: Secondary | ICD-10-CM | POA: Diagnosis not present

## 2023-07-28 HISTORY — PX: RETINAL DETACHMENT SURGERY: SHX105

## 2023-07-29 DIAGNOSIS — H401131 Primary open-angle glaucoma, bilateral, mild stage: Secondary | ICD-10-CM | POA: Diagnosis not present

## 2023-07-29 DIAGNOSIS — H43821 Vitreomacular adhesion, right eye: Secondary | ICD-10-CM | POA: Diagnosis not present

## 2023-07-29 DIAGNOSIS — H35371 Puckering of macula, right eye: Secondary | ICD-10-CM | POA: Diagnosis not present

## 2023-07-29 DIAGNOSIS — H33191 Other retinoschisis and retinal cysts, right eye: Secondary | ICD-10-CM | POA: Diagnosis not present

## 2023-07-29 DIAGNOSIS — H33192 Other retinoschisis and retinal cysts, left eye: Secondary | ICD-10-CM | POA: Diagnosis not present

## 2023-08-04 DIAGNOSIS — H401131 Primary open-angle glaucoma, bilateral, mild stage: Secondary | ICD-10-CM | POA: Diagnosis not present

## 2023-08-04 DIAGNOSIS — H33192 Other retinoschisis and retinal cysts, left eye: Secondary | ICD-10-CM | POA: Diagnosis not present

## 2023-08-04 DIAGNOSIS — H35371 Puckering of macula, right eye: Secondary | ICD-10-CM | POA: Diagnosis not present

## 2023-08-04 DIAGNOSIS — H33191 Other retinoschisis and retinal cysts, right eye: Secondary | ICD-10-CM | POA: Diagnosis not present

## 2023-08-09 DIAGNOSIS — G4733 Obstructive sleep apnea (adult) (pediatric): Secondary | ICD-10-CM | POA: Diagnosis not present

## 2023-08-25 DIAGNOSIS — H35371 Puckering of macula, right eye: Secondary | ICD-10-CM | POA: Diagnosis not present

## 2023-08-25 DIAGNOSIS — H401131 Primary open-angle glaucoma, bilateral, mild stage: Secondary | ICD-10-CM | POA: Diagnosis not present

## 2023-08-25 DIAGNOSIS — H33193 Other retinoschisis and retinal cysts, bilateral: Secondary | ICD-10-CM | POA: Diagnosis not present

## 2023-10-04 DIAGNOSIS — H33101 Unspecified retinoschisis, right eye: Secondary | ICD-10-CM | POA: Diagnosis not present

## 2023-10-04 DIAGNOSIS — H40021 Open angle with borderline findings, high risk, right eye: Secondary | ICD-10-CM | POA: Diagnosis not present

## 2023-10-04 DIAGNOSIS — Z961 Presence of intraocular lens: Secondary | ICD-10-CM | POA: Diagnosis not present

## 2023-10-04 DIAGNOSIS — H401124 Primary open-angle glaucoma, left eye, indeterminate stage: Secondary | ICD-10-CM | POA: Diagnosis not present

## 2023-10-12 ENCOUNTER — Ambulatory Visit (INDEPENDENT_AMBULATORY_CARE_PROVIDER_SITE_OTHER): Payer: Medicare HMO | Admitting: Internal Medicine

## 2023-10-12 ENCOUNTER — Encounter: Payer: Self-pay | Admitting: Internal Medicine

## 2023-10-12 ENCOUNTER — Other Ambulatory Visit: Payer: Self-pay | Admitting: Internal Medicine

## 2023-10-12 VITALS — BP 122/80 | HR 78 | Temp 97.9°F | Ht 63.0 in | Wt 197.2 lb

## 2023-10-12 DIAGNOSIS — Z6834 Body mass index (BMI) 34.0-34.9, adult: Secondary | ICD-10-CM

## 2023-10-12 DIAGNOSIS — E66811 Obesity, class 1: Secondary | ICD-10-CM

## 2023-10-12 DIAGNOSIS — E7800 Pure hypercholesterolemia, unspecified: Secondary | ICD-10-CM

## 2023-10-12 DIAGNOSIS — I10 Essential (primary) hypertension: Secondary | ICD-10-CM | POA: Diagnosis not present

## 2023-10-12 DIAGNOSIS — J4520 Mild intermittent asthma, uncomplicated: Secondary | ICD-10-CM | POA: Insufficient documentation

## 2023-10-12 DIAGNOSIS — J301 Allergic rhinitis due to pollen: Secondary | ICD-10-CM | POA: Diagnosis not present

## 2023-10-12 DIAGNOSIS — R7303 Prediabetes: Secondary | ICD-10-CM | POA: Diagnosis not present

## 2023-10-12 DIAGNOSIS — R748 Abnormal levels of other serum enzymes: Secondary | ICD-10-CM | POA: Diagnosis not present

## 2023-10-12 DIAGNOSIS — E6609 Other obesity due to excess calories: Secondary | ICD-10-CM

## 2023-10-12 DIAGNOSIS — R7309 Other abnormal glucose: Secondary | ICD-10-CM | POA: Diagnosis not present

## 2023-10-12 MED ORDER — MOMETASONE FUROATE 220 MCG/ACT IN AEPB: 1.0000 | INHALATION_SPRAY | Freq: Two times a day (BID) | RESPIRATORY_TRACT | 2 refills | Status: DC

## 2023-10-12 NOTE — Assessment & Plan Note (Signed)
 Hepatic steatosis is present. Consultation with her GI specialist, Dr. Nickey Barn, will determine the need for elastography to assess severity. - Consult with Dr. Nickey Barn regarding the need for elastography for hepatic steatosis. - Order regular liver function tests.  Addendum - Fib-4 score is 1.0, no need for elastography now. Will continue to follow.

## 2023-10-12 NOTE — Patient Instructions (Signed)
 Hypertension, Adult Hypertension is another name for high blood pressure. High blood pressure forces your heart to work harder to pump blood. This can cause problems over time. There are two numbers in a blood pressure reading. There is a top number (systolic) over a bottom number (diastolic). It is best to have a blood pressure that is below 120/80. What are the causes? The cause of this condition is not known. Some other conditions can lead to high blood pressure. What increases the risk? Some lifestyle factors can make you more likely to develop high blood pressure: Smoking. Not getting enough exercise or physical activity. Being overweight. Having too much fat, sugar, calories, or salt (sodium) in your diet. Drinking too much alcohol. Other risk factors include: Having any of these conditions: Heart disease. Diabetes. High cholesterol. Kidney disease. Obstructive sleep apnea. Having a family history of high blood pressure and high cholesterol. Age. The risk increases with age. Stress. What are the signs or symptoms? High blood pressure may not cause symptoms. Very high blood pressure (hypertensive crisis) may cause: Headache. Fast or uneven heartbeats (palpitations). Shortness of breath. Nosebleed. Vomiting or feeling like you may vomit (nauseous). Changes in how you see. Very bad chest pain. Feeling dizzy. Seizures. How is this treated? This condition is treated by making healthy lifestyle changes, such as: Eating healthy foods. Exercising more. Drinking less alcohol. Your doctor may prescribe medicine if lifestyle changes do not help enough and if: Your top number is above 130. Your bottom number is above 80. Your personal target blood pressure may vary. Follow these instructions at home: Eating and drinking  If told, follow the DASH eating plan. To follow this plan: Fill one half of your plate at each meal with fruits and vegetables. Fill one fourth of your plate  at each meal with whole grains. Whole grains include whole-wheat pasta, brown rice, and whole-grain bread. Eat or drink low-fat dairy products, such as skim milk or low-fat yogurt. Fill one fourth of your plate at each meal with low-fat (lean) proteins. Low-fat proteins include fish, chicken without skin, eggs, beans, and tofu. Avoid fatty meat, cured and processed meat, or chicken with skin. Avoid pre-made or processed food. Limit the amount of salt in your diet to less than 1,500 mg each day. Do not drink alcohol if: Your doctor tells you not to drink. You are pregnant, may be pregnant, or are planning to become pregnant. If you drink alcohol: Limit how much you have to: 0-1 drink a day for women. 0-2 drinks a day for men. Know how much alcohol is in your drink. In the [place] one drink equals one 12 oz bottle of beer (355 mL), one 5 oz glass of wine (148 mL), or one 1 oz glass of hard liquor (44 mL). Lifestyle  Work with your doctor to stay at a healthy weight or to lose weight. Ask your doctor what the best weight is for you. Get at least 30 minutes of exercise that causes your heart to beat faster (aerobic exercise) most days of the week. This may include walking, swimming, or biking. Get at least 30 minutes of exercise that strengthens your muscles (resistance exercise) at least 3 days a week. This may include lifting weights or doing Pilates. Do not smoke or use any products that contain nicotine or tobacco. If you need help quitting, ask your doctor. Check your blood pressure at home as told by your doctor. Keep all follow-up visits. Medicines Take over-the-counter and prescription medicines  only as told by your doctor. Follow directions carefully. Do not skip doses of blood pressure medicine. The medicine does not work as well if you skip doses. Skipping doses also puts you at risk for problems. Ask your doctor about side effects or reactions to medicines that you should watch  for. Contact a doctor if: You think you are having a reaction to the medicine you are taking. You have headaches that keep coming back. You feel dizzy. You have swelling in your ankles. You have trouble with your vision. Get help right away if: You get a very bad headache. You start to feel mixed up (confused). You feel weak or numb. You feel faint. You have very bad pain in your: Chest. Belly (abdomen). You vomit more than once. You have trouble breathing. These symptoms may be an emergency. Get help right away. Call 911. Do not wait to see if the symptoms will go away. Do not drive yourself to the hospital. Summary Hypertension is another name for high blood pressure. High blood pressure forces your heart to work harder to pump blood. For most people, a normal blood pressure is less than 120/80. Making healthy choices can help lower blood pressure. If your blood pressure does not get lower with healthy choices, you may need to take medicine. This information is not intended to replace advice given to you by your health care provider. Make sure you discuss any questions you have with your health care provider. Document Revised: 04/03/2021 Document Reviewed: 04/03/2021 Elsevier Patient Education  2024 [employer].

## 2023-10-12 NOTE — Assessment & Plan Note (Signed)
 Chronic, currently stable. Needs refill of Asmanex. Encouraged to avoid known triggers.

## 2023-10-12 NOTE — Assessment & Plan Note (Signed)
 Chronic, she will continue with atorvastatin M-F. She is encouraged to continue with her heart healthy lifestyle.

## 2023-10-12 NOTE — Progress Notes (Signed)
 I,Maria Kline, CMA,acting as a Neurosurgeon for Maria Aliment, MD.,have documented all relevant documentation on the behalf of Maria Aliment, MD,as directed by  Maria Aliment, MD while in the presence of Maria Aliment, MD.  Subjective:  Patient ID: Maria Kline , female    DOB: Apr 25, 1956 , 68 y.o.   MRN: [id number]  Chief Complaint  Patient presents with   Hypertension    Patient presents today for bp & cholesterol follow up. She reports compliance with medications. Denies headache, chest pain & sob. Letter sent to GYN for mammogram result.    Hyperlipidemia    HPI Discussed the use of AI scribe software for clinical note transcription with the patient, who gave verbal consent to proceed.  History of Present Illness Maria Kline is a 68 year old female with hypertension who presents for blood pressure management and follow-up on liver health.  She has been experiencing fluctuations in her blood pressure. Her last blood pressure check was in December, and she is due for a follow-up. She is on medication for hypertension, she reports compliance with meds.   She has a history of monoclonal gammopathy of undetermined significance (MGUS) and last saw her hematologist in January for follow-up. Blood work was done at that time, and she is scheduled for more labs in July.   She underwent retina surgery on January 29th for an epiretinal membrane in her right eye. She describes the condition as 'the saran wrap around the eye was pulling,' which was monitored since the previous summer. She experienced visual disturbances such as lines of text jumping up or down, prompting the surgery. The procedure was a vitrectomy membrane peel, and she reports no complications post-surgery.  She has hepatic steatosis and is under the care of a gastroenterologist. She had an ultrasound of her liver, and there is a discussion about possibly needing an elastography to evaluate the severity of the  condition. She is also due for a regular liver function test.  She is actively engaged in physical exercise, including weight training on Mondays and Aqua Zumba on Tuesdays and Thursdays. She had to pause her exercise routine for a month following her eye surgery but has since resumed. She also participates in functional fitness activities at [employer].     Hypertension This is a chronic problem. The current episode started more than 1 year ago. The problem has been gradually improving since onset. The problem is controlled. Pertinent negatives include no anxiety. Past treatments include calcium channel blockers. The current treatment provides moderate improvement. There are no compliance problems.      Past Medical History:  Diagnosis Date   Allergy    Anemia    58yrs ago   Asthma    Cataracts, bilateral    Chicken pox    Complication of anesthesia 2000   c-section; excessive shivering   Eczema    hx of   Hx of seasonal allergies    Hyperlipidemia    Measles    Mumps    Vitamin D deficiency    Vit D on tues and fri     Family History  Problem Relation Age of Onset   Hypotension Mother    Hypertension Mother    Diabetes Mother    Dementia Mother    GI problems Mother    Colon cancer Father    Cancer - Colon Father    Cancer Father    Hypertension Sister    Hypertension Paternal  Grandmother    Hypertension Paternal Grandfather    Healthy Son    Diabetes Other    Stroke Other    Hypertension Other    Heart disease Other    Hypertension Maternal Grandmother    Asthma Son    Anesthesia problems Neg Hx    Malignant hyperthermia Neg Hx    Pseudochol deficiency Neg Hx    Sleep apnea Neg Hx      Current Outpatient Medications:    albuterol (VENTOLIN HFA) 108 (90 Base) MCG/ACT inhaler, TAKE 2 PUFFS BY MOUTH EVERY 6 HOURS AS NEEDED FOR WHEEZE OR SHORTNESS OF BREATH (Patient taking differently: as needed for wheezing or shortness of breath.), Disp: 18 each, Rfl: 5    amLODipine (NORVASC) 2.5 MG tablet, Take 1 tablet (2.5 mg total) by mouth daily., Disp: 30 tablet, Rfl: 11   atorvastatin (LIPITOR) 40 MG tablet, TAKE 1 TABLET BY MOUTH EVERY DAY, Disp: 90 tablet, Rfl: 2   cetirizine (ZYRTEC) 10 MG tablet, Take 10 mg by mouth as needed for allergies or rhinitis., Disp: , Rfl:    Cholecalciferol (VITAMIN D3) 125 MCG (5000 UT) CAPS, Take by mouth., Disp: , Rfl:    LUMIGAN 0.01 % SOLN, Place 1 drop into both eyes at bedtime., Disp: , Rfl:    MAGNESIUM PO, Take 25 mg by mouth daily at 12 noon., Disp: , Rfl:    mometasone (ASMANEX) 220 MCG/ACT inhaler, Inhale 1 puff into the lungs 2 (two) times daily., Disp: 1 each, Rfl: 2   Mometasone Furoate (ASMANEX HFA) 100 MCG/ACT AERO, Inhale into the lungs as needed., Disp: , Rfl:    montelukast (SINGULAIR) 10 MG tablet, TAKE 1 TABLET BY MOUTH EVERY DAY (Patient taking differently: Take 10 mg by mouth as needed.), Disp: 90 tablet, Rfl: 2   Allergies  Allergen Reactions   Demerol [Meperidine] Itching   Penicillins Other (See Comments)    Childhood reaction      Review of Systems  Constitutional: Negative.   Respiratory: Negative.    Cardiovascular: Negative.   Gastrointestinal: Negative.   Neurological: Negative.   Psychiatric/Behavioral: Negative.       Today's Vitals   10/12/23 0926  BP: 122/80  Pulse: 78  Temp: 97.9 F (36.6 C)  SpO2: 98%  Weight: 197 lb 3.2 oz (89.4 kg)  Height: 5\' 3"  (1.6 m)   Body mass index is 34.93 kg/m.  Wt Readings from Last 3 Encounters:  10/12/23 197 lb 3.2 oz (89.4 kg)  07/15/23 193 lb 8 oz (87.8 kg)  06/01/23 192 lb (87.1 kg)     Objective:  Physical Exam Vitals and nursing note reviewed.  Constitutional:      Appearance: Normal appearance. She is obese.  HENT:     Head: Normocephalic and atraumatic.  Eyes:     Extraocular Movements: Extraocular movements intact.  Cardiovascular:     Rate and Rhythm: Normal rate and regular rhythm.     Heart sounds: Normal heart  sounds.  Pulmonary:     Effort: Pulmonary effort is normal.     Breath sounds: Normal breath sounds.  Musculoskeletal:     Cervical back: Normal range of motion.  Skin:    General: Skin is warm.  Neurological:     General: No focal deficit present.     Mental Status: She is alert.  Psychiatric:        Mood and Affect: Mood normal.        Behavior: Behavior normal.  Assessment And Plan:  Essential hypertension, benign Assessment & Plan: Chronic, controlled. Goal BP<120/80. She is encouraged to follow low sodium diet and aim for at least 150 minutes of exercise/week. She will continue with amlodipine 2.5mg  daily. Will recheck in 3-4 months. BP Readings from Last 3 Encounters:  10/12/23 122/80  07/15/23 138/82  06/01/23 124/80      Pure hypercholesterolemia Assessment & Plan: Chronic, she will continue with atorvastatin M-F. She is encouraged to continue with her heart healthy lifestyle.   Orders: -     Lipid panel -     Hepatic function panel  Elevated liver enzymes Assessment & Plan: Hepatic steatosis is present. Consultation with her GI specialist, Dr. Nickey Barn, will determine the need for elastography to assess severity. - Consult with Dr. Nickey Barn regarding the need for elastography for hepatic steatosis. - Order regular liver function tests.  Addendum - Fib-4 score is 1.0, no need for elastography now. Will continue to follow.   Orders: -     Hepatic function panel  Mild intermittent asthma without complication Assessment & Plan: Chronic, currently stable. Needs refill of Asmanex. Encouraged to avoid known triggers.    Seasonal allergic rhinitis due to pollen Assessment & Plan: Chronic, she will c/w Zyrtec 10mg  daily.    Prediabetes Assessment & Plan: Prediabetes is present. She is actively engaged in exercise, which is beneficial for management. - Retest for prediabetes in four months.  Orders: -     Hemoglobin A1c  Class 1 obesity due to excess  calories with serious comorbidity and body mass index (BMI) of 34.0 to 34.9 in adult Assessment & Plan: She is encouraged to strive for BMI less than 30 to decrease cardiac risk. Advised to aim for at least 150 minutes of exercise per week.    Other orders -     Mometasone Furoate; Inhale 1 puff into the lungs 2 (two) times daily.  Dispense: 1 each; Refill: 2   Return if symptoms worsen or fail to improve.  Patient was given opportunity to ask questions. Patient verbalized understanding of the plan and was able to repeat key elements of the plan. All questions were answered to their satisfaction.   I, Smiley Dung, MD, have reviewed all documentation for this visit. The documentation on 10/12/23 for the exam, diagnosis, procedures, and orders are all accurate and complete.   IF YOU HAVE BEEN REFERRED TO A SPECIALIST, IT MAY TAKE 1-2 WEEKS TO SCHEDULE/PROCESS THE REFERRAL. IF YOU HAVE NOT HEARD FROM US /SPECIALIST IN TWO WEEKS, PLEASE GIVE US  A CALL AT [phone].   THE PATIENT IS ENCOURAGED TO PRACTICE SOCIAL DISTANCING DUE TO THE COVID-19 PANDEMIC.

## 2023-10-12 NOTE — Assessment & Plan Note (Signed)
 Chronic, she will c/w Zyrtec 10mg  daily.

## 2023-10-12 NOTE — Assessment & Plan Note (Signed)
 Chronic, controlled. Goal BP<120/80. She is encouraged to follow low sodium diet and aim for at least 150 minutes of exercise/week. She will continue with amlodipine 2.5mg  daily. Will recheck in 3-4 months. BP Readings from Last 3 Encounters:  10/12/23 122/80  07/15/23 138/82  06/01/23 124/80

## 2023-10-12 NOTE — Assessment & Plan Note (Signed)
 She is encouraged to strive for BMI less than 30 to decrease cardiac risk. Advised to aim for at least 150 minutes of exercise per week.

## 2023-10-12 NOTE — Assessment & Plan Note (Signed)
 Prediabetes is present. She is actively engaged in exercise, which is beneficial for management. - Retest for prediabetes in four months.

## 2023-10-13 LAB — LIPID PANEL
Chol/HDL Ratio: 2.7 ratio (ref 0.0–4.4)
Cholesterol, Total: 165 mg/dL (ref 100–199)
HDL: 61 mg/dL (ref >39)
LDL Chol Calc (NIH): 78 mg/dL (ref 0–99)
Triglycerides: 154 mg/dL — ABNORMAL HIGH (ref 0–149)
VLDL Cholesterol Cal: 26 mg/dL (ref 5–40)

## 2023-10-13 LAB — HEPATIC FUNCTION PANEL
ALT: 77 IU/L — ABNORMAL HIGH (ref 0–32)
AST: 39 IU/L (ref 0–40)
Albumin: 4.2 g/dL (ref 3.9–4.9)
Alkaline Phosphatase: 117 IU/L (ref 44–121)
Bilirubin Total: 0.4 mg/dL (ref 0.0–1.2)
Bilirubin, Direct: 0.13 mg/dL (ref 0.00–0.40)
Total Protein: 7.1 g/dL (ref 6.0–8.5)

## 2023-10-13 LAB — HEMOGLOBIN A1C
Est. average glucose Bld gHb Est-mCnc: 126 mg/dL
Hgb A1c MFr Bld: 6 % — ABNORMAL HIGH (ref 4.8–5.6)

## 2023-10-16 ENCOUNTER — Encounter: Payer: Self-pay | Admitting: Internal Medicine

## 2023-11-11 ENCOUNTER — Other Ambulatory Visit: Payer: Self-pay | Admitting: Internal Medicine

## 2023-11-24 DIAGNOSIS — H33192 Other retinoschisis and retinal cysts, left eye: Secondary | ICD-10-CM | POA: Diagnosis not present

## 2023-11-24 DIAGNOSIS — H33191 Other retinoschisis and retinal cysts, right eye: Secondary | ICD-10-CM | POA: Diagnosis not present

## 2023-11-24 DIAGNOSIS — H401131 Primary open-angle glaucoma, bilateral, mild stage: Secondary | ICD-10-CM | POA: Diagnosis not present

## 2023-11-24 DIAGNOSIS — H35372 Puckering of macula, left eye: Secondary | ICD-10-CM | POA: Diagnosis not present

## 2023-12-03 ENCOUNTER — Telehealth: Payer: Self-pay | Admitting: Pharmacist

## 2023-12-03 DIAGNOSIS — E7800 Pure hypercholesterolemia, unspecified: Secondary | ICD-10-CM

## 2023-12-03 NOTE — Progress Notes (Signed)
   12/03/2023  Patient ID: Maria Kline, female   DOB: [birth year], 68 y.o.   MRN: [id number]  Pharmacy Quality Measure Review  This patient is appearing on a report for being at risk of failing the adherence measure for cholesterol (statin) medications this calendar year.   Medication: Atorvastatin   40 mg filled 07/07/23 for  a 90 day supply. Patient is taking 1 tablet Monday through Friday per clinic notes but the sig still says 1 tablet daily.  Last fill date: 60 for 07/07/2023 90 day supply  MyChart message sent to patient.   Will call the Patient in 5-7 business days. Last impact date is:    Geronimo Krabbe, PharmD, [employer] Clinical Pharmacist [phone]

## 2023-12-08 ENCOUNTER — Encounter: Payer: Self-pay | Admitting: *Deleted

## 2023-12-08 NOTE — Progress Notes (Deleted)
 PATIENT: Maria Kline DOB: 1955-09-24  REASON FOR VISIT: follow up HISTORY FROM: patient PRIMARY NEUROLOGIST:   Virtual Visit via Video Note  I connected with Floreen Hunger on 12/09/23 at  8:45 AM EDT by a video enabled telemedicine application located remotely at [facility]*** and verified that I am speaking with the correct person using two identifiers who was located at their own home***   I discussed the limitations of evaluation and management by telemedicine and the availability of in person appointments. The patient expressed understanding and agreed to proceed.   PATIENT: Maria Kline DOB: 1955/10/31  REASON FOR VISIT: follow up HISTORY FROM: patient  HISTORY OF PRESENT ILLNESS: Today   HISTORY   REVIEW OF SYSTEMS: Out of a complete 14 system review of symptoms, the patient complains only of the following symptoms, and all other reviewed systems are negative.  ALLERGIES: Allergies  Allergen Reactions  . Demerol [Meperidine] Itching  . Penicillins Other (See Comments)    Childhood reaction     HOME MEDICATIONS: Outpatient Medications Prior to Visit  Medication Sig Dispense Refill  . albuterol  (VENTOLIN  HFA) 108 (90 Base) MCG/ACT inhaler TAKE 2 PUFFS BY MOUTH EVERY 6 HOURS AS NEEDED FOR WHEEZE OR SHORTNESS OF BREATH (Patient taking differently: as needed for wheezing or shortness of breath.) 18 each 5  . amLODipine  (NORVASC ) 2.5 MG tablet TAKE 1 TABLET BY MOUTH EVERY DAY 90 tablet 3  . atorvastatin  (LIPITOR) 40 MG tablet TAKE 1 TABLET BY MOUTH EVERY DAY 90 tablet 2  . cetirizine (ZYRTEC) 10 MG tablet Take 10 mg by mouth as needed for allergies or rhinitis.    . Cholecalciferol (VITAMIN D3) 125 MCG (5000 UT) CAPS Take by mouth.    . LUMIGAN 0.01 % SOLN Place 1 drop into both eyes at bedtime.    Aaron Aas MAGNESIUM PO Take 25 mg by mouth daily at 12 noon.    . mometasone  (ASMANEX ) 220 MCG/ACT inhaler Inhale 1 puff into the lungs 2  (two) times daily. 1 each 2  . Mometasone  Furoate (ASMANEX  HFA) 100 MCG/ACT AERO Inhale into the lungs as needed.    . montelukast  (SINGULAIR ) 10 MG tablet TAKE 1 TABLET BY MOUTH EVERY DAY (Patient taking differently: Take 10 mg by mouth as needed.) 90 tablet 2   No facility-administered medications prior to visit.    PAST MEDICAL HISTORY: Past Medical History:  Diagnosis Date  . Allergy   . Anemia    60yrs ago  . Asthma   . Cataracts, bilateral   . Chicken pox   . Complication of anesthesia 2000   c-section; excessive shivering  . Eczema    hx of  . Hx of seasonal allergies   . Hyperlipidemia   . Measles   . Mumps   . Vitamin D  deficiency    Vit D on tues and fri    PAST SURGICAL HISTORY: Past Surgical History:  Procedure Laterality Date  . adenodectomy    . CAPSULOTOMY  03/10/2012   Procedure: MINOR CAPSULOTOMY;  Surgeon: Russel Courser., MD;  Location: [facility] OR;  Service: Ophthalmology;  Laterality: Left;  Left Eye  . CATARACT EXTRACTION W/PHACO  11/25/2011   Procedure: CATARACT EXTRACTION PHACO AND INTRAOCULAR LENS PLACEMENT (IOC);  Surgeon: Jewel Mortimer, MD;  Location: [facility] OR;  Service: Ophthalmology;  Laterality: Left;  . CATARACT EXTRACTION W/PHACO  12/07/2011   Procedure: CATARACT EXTRACTION PHACO AND INTRAOCULAR LENS PLACEMENT (IOC);  Surgeon:  Jewel Mortimer, MD;  Location: [facility] OR;  Service: Ophthalmology;  Laterality: Right;  . cataract surgery      left  . CESAREAN SECTION  2000  . COLONOSCOPY    . EYE SURGERY    . MYOMECTOMY  1996  . REFRACTIVE SURGERY Bilateral 02/2018   for glaucoma, performed by Dr. Gwendalyn Lemma  . RETINAL DETACHMENT SURGERY Right 07/28/2023   reitnal surgery  . TONSILLECTOMY AND ADENOIDECTOMY  1964   as a child  . WISDOM TOOTH EXTRACTION  1970    FAMILY HISTORY: Family History  Problem Relation Age of Onset  . Hypotension Mother   . Hypertension Mother   . Diabetes Mother   . Dementia Mother   . GI problems Mother   . Colon  cancer Father   . Cancer - Colon Father   . Cancer Father   . Hypertension Sister   . Hypertension Paternal Grandmother   . Hypertension Paternal Grandfather   . Healthy Son   . Diabetes Other   . Stroke Other   . Hypertension Other   . Heart disease Other   . Hypertension Maternal Grandmother   . Asthma Son   . Anesthesia problems Neg Hx   . Malignant hyperthermia Neg Hx   . Pseudochol deficiency Neg Hx   . Sleep apnea Neg Hx     SOCIAL HISTORY: Social History   Socioeconomic History  . Marital status: Married    Spouse name: Not on file  . Number of children: 1  . Years of education: Not on file  . Highest education level: Bachelor's degree (e.g., BA, AB, BS)  Occupational History  . Not on file  Tobacco Use  . Smoking status: Never    Passive exposure: Past  . Smokeless tobacco: Never  Vaping Use  . Vaping status: Never Used  Substance and Sexual Activity  . Alcohol use: Not Currently  . Drug use: No  . Sexual activity: Yes    Birth control/protection: Pill, Post-menopausal  Other Topics Concern  . Not on file  Social History Narrative   Caffiene; 12 oz every morning.     Working: Teaching laboratory technician   Married with one adult child (living in [place])   Husband as CPAP   Social Drivers of Corporate investment banker Strain: Low Risk  (10/05/2023)   Overall Financial Resource Strain (CARDIA)   . Difficulty of Paying Living Expenses: Not very hard  Food Insecurity: No Food Insecurity (10/05/2023)   Hunger Vital Sign   . Worried About Programme researcher, broadcasting/film/video in the Last Year: Never true   . Ran Out of Food in the Last Year: Never true  Transportation Needs: No Transportation Needs (10/05/2023)   PRAPARE - Transportation   . Lack of Transportation (Medical): No   . Lack of Transportation (Non-Medical): No  Physical Activity: Sufficiently Active (10/05/2023)   Exercise Vital Sign   . Days of Exercise per Week: 4 days   . Minutes of Exercise per Session: 60 min  Stress:  Patient Declined (10/05/2023)   [employer] of Occupational Health - Occupational Stress Questionnaire   . Feeling of Stress : Patient declined  Social Connections: Unknown (10/05/2023)   Social Connection and Isolation Panel   . Frequency of Communication with Friends and Family: More than three times a week   . Frequency of Social Gatherings with Friends and Family: More than three times a week   . Attends Religious Services: Patient declined   . Active Member  of Clubs or Organizations: Yes   . Attends Banker Meetings: More than 4 times per year   . Marital Status: Married  Catering manager Violence: Not At Risk (06/01/2023)   Humiliation, Afraid, Rape, and Kick questionnaire   . Fear of Current or Ex-Partner: No   . Emotionally Abused: No   . Physically Abused: No   . Sexually Abused: No      PHYSICAL EXAM Generalized: Well developed, in no acute distress   Neurological examination  Mentation: Alert oriented to time, place, history taking. Follows all commands speech and language fluent Cranial nerve II-XII: Facial symmetry noted.   DIAGNOSTIC DATA (LABS, IMAGING, TESTING) - I reviewed patient records, labs, notes, testing and imaging myself where available.  Lab Results  Component Value Date   WBC 4.1 07/15/2023   HGB 13.5 07/15/2023   HCT 40.8 07/15/2023   MCV 86.1 07/15/2023   PLT 268 07/15/2023      Component Value Date/Time   NA 140 07/15/2023 1224   NA 140 06/01/2023 1226   K 4.1 07/15/2023 1224   CL 105 07/15/2023 1224   CO2 30 07/15/2023 1224   GLUCOSE 101 (H) 07/15/2023 1224   BUN 20 07/15/2023 1224   BUN 16 06/01/2023 1226   CREATININE 0.73 07/15/2023 1224   CALCIUM  9.9 07/15/2023 1224   PROT 7.1 10/12/2023 0000   ALBUMIN 4.2 10/12/2023 0000   AST 39 10/12/2023 0000   AST 36 07/15/2023 1224   ALT 77 (H) 10/12/2023 0000   ALT 77 (H) 07/15/2023 1224   ALKPHOS 117 10/12/2023 0000   BILITOT 0.4 10/12/2023 0000   BILITOT 0.4 07/15/2023  1224   GFRNONAA >60 07/15/2023 1224   GFRAA 104 04/25/2020 1024   Lab Results  Component Value Date   CHOL 165 10/12/2023   HDL 61 10/12/2023   LDLCALC 78 10/12/2023   TRIG 154 (H) 10/12/2023   CHOLHDL 2.7 10/12/2023   Lab Results  Component Value Date   HGBA1C 6.0 (H) 10/12/2023   No results found for: VITAMINB12 Lab Results  Component Value Date   TSH 2.770 06/01/2023      ASSESSMENT AND PLAN 68 y.o. year old female  has a past medical history of Allergy, Anemia, Asthma, Cataracts, bilateral, Chicken pox, Complication of anesthesia (2000), Eczema, seasonal allergies, Hyperlipidemia, Measles, Mumps, and Vitamin D  deficiency. here with:  OSA on CPAP  CPAP compliance excellent Residual AHI is good Encouraged patient to continue using CPAP nightly and > 4 hours each night F/U in 1 year or sooner if needed  No orders of the defined types were placed in this encounter.  No orders of the defined types were placed in this encounter.     Clem Currier, MSN, NP-C 12/09/2023, 8:48 AM [facility] [address], [address] [place], [place] [address] [phone]  This encounter was created in error - please disregard.

## 2023-12-09 ENCOUNTER — Encounter: Payer: Medicare HMO | Admitting: Adult Health

## 2023-12-09 NOTE — Progress Notes (Signed)
 Visit rescheduled. Can't do a virtual visit as the patient is traveling in [place].

## 2023-12-15 DIAGNOSIS — M170 Bilateral primary osteoarthritis of knee: Secondary | ICD-10-CM | POA: Diagnosis not present

## 2023-12-15 DIAGNOSIS — S8002XA Contusion of left knee, initial encounter: Secondary | ICD-10-CM | POA: Diagnosis not present

## 2023-12-23 ENCOUNTER — Telehealth: Admitting: Adult Health

## 2023-12-23 DIAGNOSIS — G4733 Obstructive sleep apnea (adult) (pediatric): Secondary | ICD-10-CM

## 2023-12-23 NOTE — Progress Notes (Signed)
 PATIENT: Maria Kline DOB: [birth year]  REASON FOR VISIT: follow up HISTORY FROM: patient PRIMARY NEUROLOGIST: Dr. Buck   Virtual Visit via Video Note  I connected with Maria Kline on 12/23/23 at  9:00 AM EDT by a video enabled telemedicine application located remotely at [facility] and verified that I am speaking with the correct person using two identifiers who was located at their own home in [place]   I discussed the limitations of evaluation and management by telemedicine and the availability of in person appointments. The patient expressed understanding and agreed to proceed.   PATIENT: Maria Kline DOB: August 20, 1955  REASON FOR VISIT: follow up HISTORY FROM: patient  HISTORY OF PRESENT ILLNESS: Today 12/23/23  Maria Kline is a 68 y.o. female with a history of obstructive sleep apnea on CPAP. Returns today for follow-up.  She reports that the CPAP is working well for her.  She denies any new issues.  Continues to find it beneficial.  Download is below    HISTORY  12/08/22:   Maria Kline is a 68 y.o. female with a history of obstructive sleep apnea on CPAP. Returns today for follow-up.  She had sleep study that showed severe sleep apnea.  She was started on CPAP therapy.  She is here today for her initial download.  She reports that the CPAP has been working well for her.  She states that it has changed her life.  Her download is below    REVIEW OF SYSTEMS: Out of a complete 14 system review of symptoms, the patient complains only of the following symptoms, and all other reviewed systems are negative.  none  ALLERGIES: Allergies  Allergen Reactions   Demerol [Meperidine] Itching   Penicillins Other (See Comments)    Childhood reaction     HOME MEDICATIONS: Outpatient Medications Prior to Visit  Medication Sig Dispense Refill   albuterol  (VENTOLIN  HFA) 108 (90 Base) MCG/ACT inhaler TAKE 2 PUFFS BY MOUTH EVERY 6  HOURS AS NEEDED FOR WHEEZE OR SHORTNESS OF BREATH (Patient taking differently: as needed for wheezing or shortness of breath.) 18 each 5   amLODipine  (NORVASC ) 2.5 MG tablet TAKE 1 TABLET BY MOUTH EVERY DAY 90 tablet 3   atorvastatin  (LIPITOR) 40 MG tablet TAKE 1 TABLET BY MOUTH EVERY DAY 90 tablet 2   cetirizine (ZYRTEC) 10 MG tablet Take 10 mg by mouth as needed for allergies or rhinitis.     Cholecalciferol (VITAMIN D3) 125 MCG (5000 UT) CAPS Take by mouth.     LUMIGAN 0.01 % SOLN Place 1 drop into both eyes at bedtime.     MAGNESIUM PO Take 25 mg by mouth daily at 12 noon.     mometasone  (ASMANEX ) 220 MCG/ACT inhaler Inhale 1 puff into the lungs 2 (two) times daily. 1 each 2   Mometasone  Furoate (ASMANEX  HFA) 100 MCG/ACT AERO Inhale into the lungs as needed.     montelukast  (SINGULAIR ) 10 MG tablet TAKE 1 TABLET BY MOUTH EVERY DAY (Patient taking differently: Take 10 mg by mouth as needed.) 90 tablet 2   No facility-administered medications prior to visit.    PAST MEDICAL HISTORY: Past Medical History:  Diagnosis Date   Allergy    Anemia    63yrs ago   Asthma    Cataracts, bilateral    Chicken pox    Complication of anesthesia 2000   c-section; excessive shivering   Eczema  hx of   Hx of seasonal allergies    Hyperlipidemia    Measles    Mumps    Vitamin D  deficiency    Vit D on tues and fri    PAST SURGICAL HISTORY: Past Surgical History:  Procedure Laterality Date   adenodectomy     CAPSULOTOMY  03/10/2012   Procedure: MINOR CAPSULOTOMY;  Surgeon: Debby FORBES Sharalyn Mickey., MD;  Location: [facility] OR;  Service: Ophthalmology;  Laterality: Left;  Left Eye   CATARACT EXTRACTION W/PHACO  11/25/2011   Procedure: CATARACT EXTRACTION PHACO AND INTRAOCULAR LENS PLACEMENT (IOC);  Surgeon: Jestine Bunnell, MD;  Location: [facility] OR;  Service: Ophthalmology;  Laterality: Left;   CATARACT EXTRACTION W/PHACO  12/07/2011   Procedure: CATARACT EXTRACTION PHACO AND INTRAOCULAR LENS PLACEMENT  (IOC);  Surgeon: Jestine Bunnell, MD;  Location: [facility] OR;  Service: Ophthalmology;  Laterality: Right;   cataract surgery      left   CESAREAN SECTION  2000   COLONOSCOPY     EYE SURGERY     MYOMECTOMY  1996   REFRACTIVE SURGERY Bilateral 02/2018   for glaucoma, performed by Dr. Cyrus   RETINAL DETACHMENT SURGERY Right 07/28/2023   reitnal surgery   TONSILLECTOMY AND ADENOIDECTOMY  1964   as a child   WISDOM TOOTH EXTRACTION  1970    FAMILY HISTORY: Family History  Problem Relation Age of Onset   Hypotension Mother    Hypertension Mother    Diabetes Mother    Dementia Mother    GI problems Mother    Colon cancer Father    Cancer - Colon Father    Cancer Father    Hypertension Sister    Hypertension Paternal Grandmother    Hypertension Paternal Grandfather    Healthy Son    Diabetes Other    Stroke Other    Hypertension Other    Heart disease Other    Hypertension Maternal Grandmother    Asthma Son    Anesthesia problems Neg Hx    Malignant hyperthermia Neg Hx    Pseudochol deficiency Neg Hx    Sleep apnea Neg Hx     SOCIAL HISTORY: Social History   Socioeconomic History   Marital status: Married    Spouse name: Not on file   Number of children: 1   Years of education: Not on file   Highest education level: Bachelor's degree (e.g., BA, AB, BS)  Occupational History   Not on file  Tobacco Use   Smoking status: Never    Passive exposure: Past   Smokeless tobacco: Never  Vaping Use   Vaping status: Never Used  Substance and Sexual Activity   Alcohol use: Not Currently   Drug use: No   Sexual activity: Yes    Birth control/protection: Pill, Post-menopausal  Other Topics Concern   Not on file  Social History Narrative   Caffiene; 12 oz every morning.     Working: Teaching laboratory technician   Married with one adult child (living in )   Husband as CPAP   Social Drivers of Corporate investment banker Strain: Low Risk  (10/05/2023)   Overall Financial Resource  Strain (CARDIA)    Difficulty of Paying Living Expenses: Not very hard  Food Insecurity: No Food Insecurity (10/05/2023)   Hunger Vital Sign    Worried About Running Out of Food in the Last Year: Never true    Ran Out of Food in the Last Year: Never true  Transportation Needs: No Transportation Needs (  10/05/2023)   PRAPARE - Administrator, Civil Service (Medical): No    Lack of Transportation (Non-Medical): No  Physical Activity: Sufficiently Active (10/05/2023)   Exercise Vital Sign    Days of Exercise per Week: 4 days    Minutes of Exercise per Session: 60 min  Stress: Patient Declined (10/05/2023)   [employer] of Occupational Health - Occupational Stress Questionnaire    Feeling of Stress : Patient declined  Social Connections: Unknown (10/05/2023)   Social Connection and Isolation Panel    Frequency of Communication with Friends and Family: More than three times a week    Frequency of Social Gatherings with Friends and Family: More than three times a week    Attends Religious Services: Patient declined    Database administrator or Organizations: Yes    Attends Engineer, structural: More than 4 times per year    Marital Status: Married  Catering manager Violence: Not At Risk (06/01/2023)   Humiliation, Afraid, Rape, and Kick questionnaire    Fear of Current or Ex-Partner: No    Emotionally Abused: No    Physically Abused: No    Sexually Abused: No      PHYSICAL EXAM Generalized: Well developed, in no acute distress   Neurological examination  Mentation: Alert oriented to time, place, history taking. Follows all commands speech and language fluent Cranial nerve II-XII: Facial symmetry noted.   DIAGNOSTIC DATA (LABS, IMAGING, TESTING) - I reviewed patient records, labs, notes, testing and imaging myself where available.  Lab Results  Component Value Date   WBC 4.1 07/15/2023   HGB 13.5 07/15/2023   HCT 40.8 07/15/2023   MCV 86.1 07/15/2023   PLT  268 07/15/2023      Component Value Date/Time   NA 140 07/15/2023 1224   NA 140 06/01/2023 1226   K 4.1 07/15/2023 1224   CL 105 07/15/2023 1224   CO2 30 07/15/2023 1224   GLUCOSE 101 (H) 07/15/2023 1224   BUN 20 07/15/2023 1224   BUN 16 06/01/2023 1226   CREATININE 0.73 07/15/2023 1224   CALCIUM  9.9 07/15/2023 1224   PROT 7.1 10/12/2023 0000   ALBUMIN 4.2 10/12/2023 0000   AST 39 10/12/2023 0000   AST 36 07/15/2023 1224   ALT 77 (H) 10/12/2023 0000   ALT 77 (H) 07/15/2023 1224   ALKPHOS 117 10/12/2023 0000   BILITOT 0.4 10/12/2023 0000   BILITOT 0.4 07/15/2023 1224   GFRNONAA >60 07/15/2023 1224   GFRAA 104 04/25/2020 1024   Lab Results  Component Value Date   CHOL 165 10/12/2023   HDL 61 10/12/2023   LDLCALC 78 10/12/2023   TRIG 154 (H) 10/12/2023   CHOLHDL 2.7 10/12/2023   Lab Results  Component Value Date   HGBA1C 6.0 (H) 10/12/2023   No results found for: VITAMINB12 Lab Results  Component Value Date   TSH 2.770 06/01/2023      ASSESSMENT AND PLAN 68 y.o. year old female  has a past medical history of Allergy, Anemia, Asthma, Cataracts, bilateral, Chicken pox, Complication of anesthesia (2000), Eczema, seasonal allergies, Hyperlipidemia, Measles, Mumps, and Vitamin D  deficiency. here with:  OSA on CPAP  CPAP compliance excellent Residual AHI is good Encouraged patient to continue using CPAP nightly and > 4 hours each night F/U in 1 year or sooner if needed   Duwaine Russell, MSN, NP-C 12/23/2023, 7:51 AM [facility] [address], [address] [place], [place] [address] [phone]  The  patient's condition requires frequent monitoring and adjustments in the treatment plan, reflecting the ongoing complexity of care.  This provider is the continuing focal point for all needed services for this condition.

## 2023-12-23 NOTE — Patient Instructions (Signed)
 Continue using CPAP nightly and greater than 4 hours each night If your symptoms worsen or you develop new symptoms please let us know.

## 2024-01-12 ENCOUNTER — Other Ambulatory Visit: Payer: Self-pay | Admitting: Nurse Practitioner

## 2024-01-12 DIAGNOSIS — D472 Monoclonal gammopathy: Secondary | ICD-10-CM

## 2024-01-12 NOTE — Progress Notes (Signed)
 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  Serum IgM level was normal -No anemia, her renal function and calcium  level has been normal lately.  No clinical concern for multiple myeloma -initial work up in 12/2022 showed normal kappa lambda light chains and normal urine protein and light chains.  Bone survey showed degenerative changes in multiple joints.  There were no focal lytic lesions in the bony structures.  07/15/2023 -serum free light chains were normal.  M protein was 0.4.  Normal kidney functions.  She was not anemic. 01/13/2024 -serum free light chains are normal.  Awaiting results of myeloma panel.  Kidney functions, calcium , and CBC are all unremarkable. - Plan to repeat labs in 6 months - Labs and follow-up in 1 year, sooner if needed.   Elevated liver functions Her AST and ALT both moderately elevated today.  Alkaline phosphatase is normal.  Patient reports having consumed alcoholic beverages recently to celebrate her birthday.  She is concerned about about NASH.  Liver functions are monitored by her primary care.  Abdominal ultrasound is planned for the near future.  MGUS Serum free light chains are normal today.  Awaiting results of myeloma panel.  M protein on 07/15/2023 was slightly elevated at 0.4.  Will notify patient of results when they are returned.  Plan Labs reviewed. - Normal CBC. - CMP unremarkable other than elevated LFTs.  She will discuss with primary care. - Normal free light chains. - Awaiting results of myeloma panel. Recheck labs in 6 months. Labs and follow-up in 6 months, sooner if needed.  The patient understands the plans  discussed today and is in agreement with them.  She knows to contact our office if she develops concerns prior to her next appointment.  I provided 20 minutes of face-to-face time during this encounter and > 50% was spent counseling as documented under my assessment and plan.    Powell FORBES Lessen, NP  [facility] - A DEPT OF [facility] [address] [place] [address] Dept: [phone] Dept Fax: [phone]   No orders of the defined types were placed in this encounter.     CHIEF COMPLAINT:  CC: MGUS  Current Treatment: Surveillance  INTERVAL HISTORY:  Maria Kline is here today for repeat clinical assessment.  She was last seen by me on 07/15/2023.  M protein spike 0.4.  Serum free light chains were normal.  Kidney functions were normal.  She was not anemic.  She denies new symptoms or concerns today.  She denies chest pain, chest pressure, or shortness of breath. She denies headaches or visual disturbances. She denies abdominal pain, nausea, vomiting, or changes in bowel or bladder habits.   She denies fevers or chills.  Bone survey done on 01/18/2023 showed no focal lytic lesions in the bony structures.  She denies pain. Her appetite is good. Her weight has increased 7 pounds over last 6 months.  I have reviewed the past medical history, past surgical history, social history and family history with the patient and they are unchanged from previous note.  ALLERGIES:  is allergic to demerol [meperidine] and penicillins.  MEDICATIONS:  Current Outpatient Medications  Medication Sig Dispense Refill   albuterol  (VENTOLIN  HFA) 108 (90 Base) MCG/ACT inhaler TAKE 2 PUFFS BY MOUTH EVERY 6 HOURS AS NEEDED FOR WHEEZE OR SHORTNESS OF BREATH (Patient taking differently: as needed for wheezing or shortness of breath.) 18 each 5   amLODipine  (NORVASC ) 2.5 MG tablet TAKE 1 TABLET BY MOUTH EVERY DAY 90 tablet 3   atorvastatin  (LIPITOR)  40 MG tablet TAKE 1 TABLET BY MOUTH EVERY DAY (Patient taking differently: Take 40 mg by mouth daily. Take every Monday Wednesday Friday) 90 tablet 2   cetirizine (ZYRTEC) 10 MG tablet Take 10 mg by mouth as needed for allergies or rhinitis.     Cholecalciferol (VITAMIN D3) 125 MCG (5000 UT) CAPS Take by mouth.     LUMIGAN 0.01 % SOLN Place 1 drop into both eyes at bedtime.     MAGNESIUM PO Take 25 mg by mouth daily at 12 noon.     montelukast  (SINGULAIR ) 10 MG tablet TAKE 1 TABLET BY MOUTH EVERY DAY (Patient taking differently: Take 10 mg by mouth as needed.) 90 tablet 2   No current facility-administered medications for this visit.     REVIEW OF SYSTEMS:   Constitutional: Denies fevers, chills or abnormal weight loss Eyes: Denies blurriness of vision Ears, nose, mouth, throat, and face: Denies mucositis or sore throat Respiratory: Denies cough, dyspnea or wheezes Cardiovascular: Denies palpitation, chest discomfort or lower extremity swelling Gastrointestinal:  Denies nausea, heartburn or change in bowel habits Skin: Denies abnormal skin rashes Lymphatics: Denies new lymphadenopathy or easy bruising Neurological:Denies numbness, tingling or new weaknesses Behavioral/Psych: Mood is stable, no new changes  All other systems were reviewed with the patient and are negative.   VITALS:   Today's Vitals   01/13/24 1034 01/13/24 1045  BP: 138/80   Pulse: 78   Resp: 17   Temp: (!) 97.5 F (36.4 C)   SpO2: 98%   Weight: 200 lb 12.8 oz (91.1 kg)   PainSc:  0-No pain   Body mass index is 35.57 kg/m.   Wt Readings from Last 3 Encounters:  01/13/24 200 lb 12.8 oz (91.1 kg)  10/12/23 197 lb 3.2 oz (89.4 kg)  07/15/23 193 lb 8 oz (87.8 kg)    Body mass index is 35.57 kg/m.  Performance status (ECOG): 0 - Asymptomatic  PHYSICAL EXAM:   GENERAL:alert, no distress and comfortable SKIN: skin color, texture, turgor are normal, no rashes or significant lesions EYES: normal,  Conjunctiva are pink and non-injected, sclera clear OROPHARYNX:no exudate, no erythema and lips, buccal mucosa, and tongue normal  NECK: supple, thyroid  normal size, non-tender, without nodularity LYMPH:  no palpable lymphadenopathy in the cervical, axillary or inguinal LUNGS: clear to auscultation and percussion with normal breathing effort HEART: regular rate & rhythm and no murmurs and no lower extremity edema ABDOMEN:abdomen soft, non-tender and normal bowel sounds Musculoskeletal:no cyanosis of digits and no clubbing  NEURO: alert & oriented x 3 with fluent speech, no focal motor/sensory deficits  LABORATORY DATA:  I have reviewed the data as listed    Component Value Date/Time   NA 140 01/13/2024 1019   NA 140 06/01/2023 1226   K 4.3 01/13/2024 1019   CL 108 01/13/2024 1019   CO2 27 01/13/2024 1019   GLUCOSE 91 01/13/2024 1019   BUN 15 01/13/2024 1019   BUN 16 06/01/2023 1226   CREATININE 0.70 01/13/2024 1019   CALCIUM  9.2 01/13/2024 1019   PROT 7.3 01/13/2024 1019  PROT 7.1 10/12/2023 0000   ALBUMIN 4.0 01/13/2024 1019   ALBUMIN 4.2 10/12/2023 0000   AST 62 (H) 01/13/2024 1019   ALT 115 (H) 01/13/2024 1019   ALKPHOS 108 01/13/2024 1019   BILITOT 0.5 01/13/2024 1019   GFRNONAA >60 01/13/2024 1019   GFRAA 104 04/25/2020 1024     Lab Results  Component Value Date   WBC 3.4 (L) 01/13/2024   NEUTROABS 1.6 (L) 01/13/2024   HGB 13.5 01/13/2024   HCT 39.7 01/13/2024   MCV 85.0 01/13/2024   PLT 234 01/13/2024

## 2024-01-12 NOTE — Assessment & Plan Note (Addendum)
 MGUS, IgM  -This was discovered on routine lab work, M protein was positive but low level, 0.3, immunofixation was positive for IgM.  Serum IgM level was normal -No anemia, her renal function and calcium  level has been normal lately.  No clinical concern for multiple myeloma -initial work up in 12/2022 showed normal kappa lambda light chains and normal urine protein and light chains.  Bone survey showed degenerative changes in multiple joints.  There were no focal lytic lesions in the bony structures.  07/15/2023 -serum free light chains were normal.  M protein was 0.4.  Normal kidney functions.  She was not anemic. 01/13/2024 -serum free light chains are normal.  Awaiting results of myeloma panel.  Kidney functions, calcium , and CBC are all unremarkable. - Plan to repeat labs in 6 months - Labs and follow-up in 1 year, sooner if needed.

## 2024-01-13 ENCOUNTER — Inpatient Hospital Stay (HOSPITAL_BASED_OUTPATIENT_CLINIC_OR_DEPARTMENT_OTHER): Payer: Medicare HMO | Admitting: Nurse Practitioner

## 2024-01-13 ENCOUNTER — Inpatient Hospital Stay: Payer: Medicare HMO | Attending: Nurse Practitioner

## 2024-01-13 VITALS — BP 138/80 | HR 78 | Temp 97.5°F | Resp 17 | Wt 200.8 lb

## 2024-01-13 DIAGNOSIS — R748 Abnormal levels of other serum enzymes: Secondary | ICD-10-CM | POA: Insufficient documentation

## 2024-01-13 DIAGNOSIS — D472 Monoclonal gammopathy: Secondary | ICD-10-CM | POA: Insufficient documentation

## 2024-01-13 LAB — CMP (CANCER CENTER ONLY)
ALT: 115 U/L — ABNORMAL HIGH (ref 0–44)
AST: 62 U/L — ABNORMAL HIGH (ref 15–41)
Albumin: 4 g/dL (ref 3.5–5.0)
Alkaline Phosphatase: 108 U/L (ref 38–126)
Anion gap: 5 (ref 5–15)
BUN: 15 mg/dL (ref 8–23)
CO2: 27 mmol/L (ref 22–32)
Calcium: 9.2 mg/dL (ref 8.9–10.3)
Chloride: 108 mmol/L (ref 98–111)
Creatinine: 0.7 mg/dL (ref 0.44–1.00)
GFR, Estimated: >60 mL/min (ref >60)
Glucose, Bld: 91 mg/dL (ref 70–99)
Potassium: 4.3 mmol/L (ref 3.5–5.1)
Sodium: 140 mmol/L (ref 135–145)
Total Bilirubin: 0.5 mg/dL (ref 0.0–1.2)
Total Protein: 7.3 g/dL (ref 6.5–8.1)

## 2024-01-13 LAB — CBC WITH DIFFERENTIAL (CANCER CENTER ONLY)
Abs Immature Granulocytes: 0 K/uL (ref 0.00–0.07)
Basophils Absolute: 0 K/uL (ref 0.0–0.1)
Basophils Relative: 1 %
Eosinophils Absolute: 0.1 K/uL (ref 0.0–0.5)
Eosinophils Relative: 2 %
HCT: 39.7 % (ref 36.0–46.0)
Hemoglobin: 13.5 g/dL (ref 12.0–15.0)
Immature Granulocytes: 0 %
Lymphocytes Relative: 42 %
Lymphs Abs: 1.4 K/uL (ref 0.7–4.0)
MCH: 28.9 pg (ref 26.0–34.0)
MCHC: 34 g/dL (ref 30.0–36.0)
MCV: 85 fL (ref 80.0–100.0)
Monocytes Absolute: 0.3 K/uL (ref 0.1–1.0)
Monocytes Relative: 9 %
Neutro Abs: 1.6 K/uL — ABNORMAL LOW (ref 1.7–7.7)
Neutrophils Relative %: 46 %
Platelet Count: 234 K/uL (ref 150–400)
RBC: 4.67 MIL/uL (ref 3.87–5.11)
RDW: 13.4 % (ref 11.5–15.5)
WBC Count: 3.4 K/uL — ABNORMAL LOW (ref 4.0–10.5)
nRBC: 0 % (ref 0.0–0.2)

## 2024-01-14 LAB — KAPPA/LAMBDA LIGHT CHAINS
Kappa free light chain: 15.7 mg/L (ref 3.3–19.4)
Kappa, lambda light chain ratio: 1.23 (ref 0.26–1.65)
Lambda free light chains: 12.8 mg/L (ref 5.7–26.3)

## 2024-01-16 ENCOUNTER — Encounter: Payer: Self-pay | Admitting: Nurse Practitioner

## 2024-01-17 ENCOUNTER — Telehealth: Payer: Self-pay | Admitting: Nurse Practitioner

## 2024-01-17 LAB — MULTIPLE MYELOMA PANEL, SERUM
Albumin SerPl Elph-Mcnc: 3.9 g/dL (ref 2.9–4.4)
Albumin/Glob SerPl: 1.4 (ref 0.7–1.7)
Alpha 1: 0.1 g/dL (ref 0.0–0.4)
Alpha2 Glob SerPl Elph-Mcnc: 0.6 g/dL (ref 0.4–1.0)
B-Globulin SerPl Elph-Mcnc: 1.1 g/dL (ref 0.7–1.3)
Gamma Glob SerPl Elph-Mcnc: 1.1 g/dL (ref 0.4–1.8)
Globulin, Total: 2.9 g/dL (ref 2.2–3.9)
IgA: 192 mg/dL (ref 87–352)
IgG (Immunoglobin G), Serum: 1266 mg/dL (ref 586–1602)
IgM (Immunoglobulin M), Srm: 193 mg/dL (ref 26–217)
Total Protein ELP: 6.8 g/dL (ref 6.0–8.5)

## 2024-01-17 NOTE — Telephone Encounter (Signed)
 Scheduled appointments per los. Called and left a VM with appointment details for the patient.

## 2024-01-18 ENCOUNTER — Ambulatory Visit: Payer: Self-pay | Admitting: Nurse Practitioner

## 2024-01-30 ENCOUNTER — Other Ambulatory Visit: Payer: Self-pay | Admitting: Internal Medicine

## 2024-02-07 DIAGNOSIS — M5030 Other cervical disc degeneration, unspecified cervical region: Secondary | ICD-10-CM | POA: Diagnosis not present

## 2024-03-01 DIAGNOSIS — M542 Cervicalgia: Secondary | ICD-10-CM | POA: Diagnosis not present

## 2024-03-01 DIAGNOSIS — M5030 Other cervical disc degeneration, unspecified cervical region: Secondary | ICD-10-CM | POA: Diagnosis not present

## 2024-03-08 DIAGNOSIS — M542 Cervicalgia: Secondary | ICD-10-CM | POA: Diagnosis not present

## 2024-03-08 DIAGNOSIS — M5030 Other cervical disc degeneration, unspecified cervical region: Secondary | ICD-10-CM | POA: Diagnosis not present

## 2024-03-10 DIAGNOSIS — M5030 Other cervical disc degeneration, unspecified cervical region: Secondary | ICD-10-CM | POA: Diagnosis not present

## 2024-03-10 DIAGNOSIS — M542 Cervicalgia: Secondary | ICD-10-CM | POA: Diagnosis not present

## 2024-03-14 DIAGNOSIS — M542 Cervicalgia: Secondary | ICD-10-CM | POA: Diagnosis not present

## 2024-03-14 DIAGNOSIS — M5030 Other cervical disc degeneration, unspecified cervical region: Secondary | ICD-10-CM | POA: Diagnosis not present

## 2024-03-16 DIAGNOSIS — M5030 Other cervical disc degeneration, unspecified cervical region: Secondary | ICD-10-CM | POA: Diagnosis not present

## 2024-03-16 DIAGNOSIS — M542 Cervicalgia: Secondary | ICD-10-CM | POA: Diagnosis not present

## 2024-03-20 DIAGNOSIS — M542 Cervicalgia: Secondary | ICD-10-CM | POA: Diagnosis not present

## 2024-03-20 DIAGNOSIS — M5030 Other cervical disc degeneration, unspecified cervical region: Secondary | ICD-10-CM | POA: Diagnosis not present

## 2024-03-28 DIAGNOSIS — M5030 Other cervical disc degeneration, unspecified cervical region: Secondary | ICD-10-CM | POA: Diagnosis not present

## 2024-03-28 DIAGNOSIS — M542 Cervicalgia: Secondary | ICD-10-CM | POA: Diagnosis not present

## 2024-03-30 DIAGNOSIS — M542 Cervicalgia: Secondary | ICD-10-CM | POA: Diagnosis not present

## 2024-03-30 DIAGNOSIS — M5030 Other cervical disc degeneration, unspecified cervical region: Secondary | ICD-10-CM | POA: Diagnosis not present

## 2024-04-04 DIAGNOSIS — M542 Cervicalgia: Secondary | ICD-10-CM | POA: Diagnosis not present

## 2024-04-04 DIAGNOSIS — M5030 Other cervical disc degeneration, unspecified cervical region: Secondary | ICD-10-CM | POA: Diagnosis not present

## 2024-04-10 DIAGNOSIS — H40021 Open angle with borderline findings, high risk, right eye: Secondary | ICD-10-CM | POA: Diagnosis not present

## 2024-04-10 DIAGNOSIS — H401124 Primary open-angle glaucoma, left eye, indeterminate stage: Secondary | ICD-10-CM | POA: Diagnosis not present

## 2024-04-10 DIAGNOSIS — Z961 Presence of intraocular lens: Secondary | ICD-10-CM | POA: Diagnosis not present

## 2024-06-01 ENCOUNTER — Encounter: Payer: Self-pay | Admitting: Internal Medicine

## 2024-06-01 ENCOUNTER — Other Ambulatory Visit: Payer: Self-pay

## 2024-06-01 ENCOUNTER — Ambulatory Visit: Payer: Medicare HMO | Admitting: Internal Medicine

## 2024-06-01 VITALS — BP 118/70 | HR 77 | Temp 98.4°F | Ht 63.0 in | Wt 202.8 lb

## 2024-06-01 DIAGNOSIS — J4520 Mild intermittent asthma, uncomplicated: Secondary | ICD-10-CM

## 2024-06-01 DIAGNOSIS — R7303 Prediabetes: Secondary | ICD-10-CM

## 2024-06-01 DIAGNOSIS — I10 Essential (primary) hypertension: Secondary | ICD-10-CM | POA: Diagnosis not present

## 2024-06-01 DIAGNOSIS — R9431 Abnormal electrocardiogram [ECG] [EKG]: Secondary | ICD-10-CM

## 2024-06-01 DIAGNOSIS — L918 Other hypertrophic disorders of the skin: Secondary | ICD-10-CM | POA: Insufficient documentation

## 2024-06-01 DIAGNOSIS — E7800 Pure hypercholesterolemia, unspecified: Secondary | ICD-10-CM | POA: Diagnosis not present

## 2024-06-01 DIAGNOSIS — Z0000 Encounter for general adult medical examination without abnormal findings: Secondary | ICD-10-CM | POA: Insufficient documentation

## 2024-06-01 DIAGNOSIS — R748 Abnormal levels of other serum enzymes: Secondary | ICD-10-CM | POA: Diagnosis not present

## 2024-06-01 DIAGNOSIS — M810 Age-related osteoporosis without current pathological fracture: Secondary | ICD-10-CM

## 2024-06-01 LAB — POCT URINALYSIS DIPSTICK
Bilirubin, UA: NEGATIVE
Glucose, UA: NEGATIVE
Ketones, UA: NEGATIVE
Leukocytes, UA: NEGATIVE
Nitrite, UA: NEGATIVE
Protein, UA: NEGATIVE
Spec Grav, UA: 1.02 (ref 1.010–1.025)
Urobilinogen, UA: 0.2 U/dL
pH, UA: 7 (ref 5.0–8.0)

## 2024-06-01 MED ORDER — BUDESONIDE-FORMOTEROL FUMARATE 80-4.5 MCG/ACT IN AERO: INHALATION_SPRAY | RESPIRATORY_TRACT | 12 refills | Status: AC

## 2024-06-01 MED ORDER — ALBUTEROL SULFATE HFA 108 (90 BASE) MCG/ACT IN AERS: INHALATION_SPRAY | RESPIRATORY_TRACT | 5 refills | Status: AC

## 2024-06-01 MED ORDER — ATORVASTATIN CALCIUM 40 MG PO TABS: ORAL_TABLET | ORAL | Status: AC

## 2024-06-01 MED ORDER — ALBUTEROL SULFATE HFA 108 (90 BASE) MCG/ACT IN AERS: INHALATION_SPRAY | RESPIRATORY_TRACT | 5 refills | Status: DC

## 2024-06-01 NOTE — Progress Notes (Signed)
 I,Victoria T Emmitt, CMA,acting as a neurosurgeon for Catheryn LOISE Slocumb, MD.,have documented all relevant documentation on the behalf of Catheryn LOISE Slocumb, MD,as directed by  Catheryn LOISE Slocumb, MD while in the presence of Catheryn LOISE Slocumb, MD.  Subjective:    Patient ID: Maria Kline , female    DOB: 23-Apr-1956 , 68 y.o.   MRN: [id number]  Chief Complaint  Patient presents with   Annual Exam    Patient being seen for annual exam. Patient is compliant with all medications. Patient denies any SOB, chest pain, or headaches at this time. Patient concerned about weight gain and has skin tags under right arm that she wants looked at.    HPI Discussed the use of AI scribe software for clinical note transcription with the patient, who gave verbal consent to proceed.  History of Present Illness Maria Kline is a 68 year old female with hypertension and osteoporosis who presents for a physical exam and blood pressure check.  She is concerned about an increase in the number of skin tags under her arms, particularly on the right side. She has had skin tags previously on her neck, which were removed by a professional.  She has a history of osteoporosis and osteopenia, with previous bone density scans showing osteoporosis in the spine and osteopenia in the femoral neck. In March, she experienced a fall that resulted in soft tissue injury to both knees, one on concrete and the other on grass, but no fractures. She managed the injury with ice and elevation.  In August, she experienced cervical spine issues with shooting pains down her arms into her hands. She received prednisone  and a shot in her arm, followed by six weeks of physical therapy. She reports that her doctor told her her neck was not curved as expected based on an x-ray.  She has been unable to lose weight despite regular exercise, including aqua aerobics and personal training. Her diet includes granola, Greek yogurt, a hard-boiled egg, and fruit  for breakfast. She acknowledges that stress, including her husband's recent hospitalization for a broken femur, may be affecting her weight. She is proactive in managing stress by engaging in activities that bring her joy, such as attending plays, movies, and spending time with friends.   Hypertension This is a chronic problem. The current episode started more than 1 year ago. The problem has been gradually improving since onset. The problem is controlled. Pertinent negatives include no anxiety. Past treatments include calcium  channel blockers. The current treatment provides moderate improvement. There are no compliance problems.      Past Medical History:  Diagnosis Date   Allergy    Anemia    41yrs ago   Asthma    Cataracts, bilateral    Chicken pox    Complication of anesthesia 2000   c-section; excessive shivering   Eczema    hx of   Hx of seasonal allergies    Hyperlipidemia    Measles    Mumps    Sleep apnea    Vitamin D  deficiency    Vit D on tues and fri     Family History  Problem Relation Age of Onset   Hypotension Mother    Hypertension Mother    Diabetes Mother    Dementia Mother    GI problems Mother    Colon cancer Father    Cancer - Colon Father    Cancer Father    Hypertension Sister    Hypertension  Paternal Grandmother    Hypertension Paternal Grandfather    Healthy Son    Diabetes Other    Stroke Other    Hypertension Other    Heart disease Other    Hypertension Maternal Grandmother    Asthma Son    Anesthesia problems Neg Hx    Malignant hyperthermia Neg Hx    Pseudochol deficiency Neg Hx    Sleep apnea Neg Hx      Current Outpatient Medications:    amLODipine  (NORVASC ) 2.5 MG tablet, TAKE 1 TABLET BY MOUTH EVERY DAY, Disp: 90 tablet, Rfl: 3   budesonide -formoterol  (SYMBICORT ) 80-4.5 MCG/ACT inhaler, 2 puffs twice daily, Disp: 1 each, Rfl: 12   cetirizine (ZYRTEC) 10 MG tablet, Take 10 mg by mouth as needed for allergies or rhinitis., Disp:  , Rfl:    Cholecalciferol (VITAMIN D3) 125 MCG (5000 UT) CAPS, Take by mouth., Disp: , Rfl:    LUMIGAN 0.01 % SOLN, Place 1 drop into both eyes at bedtime., Disp: , Rfl:    MAGNESIUM PO, Take 25 mg by mouth daily at 12 noon., Disp: , Rfl:    montelukast  (SINGULAIR ) 10 MG tablet, TAKE 1 TABLET BY MOUTH EVERY DAY, Disp: 90 tablet, Rfl: 2   albuterol  (VENTOLIN  HFA) 108 (90 Base) MCG/ACT inhaler, TAKE 2 PUFFS BY MOUTH EVERY 6 HOURS AS NEEDED. FOR WHEEZE OR SOB., Disp: 18 each, Rfl: 5   atorvastatin  (LIPITOR) 40 MG tablet, Take one tab po MWF, Disp: , Rfl:    Allergies  Allergen Reactions   Demerol [Meperidine] Itching   Penicillins Other (See Comments)    Childhood reaction       The patient states she uses post menopausal status for birth control. Patient's last menstrual period was 09/20/2010.. Negative for Dysmenorrhea. Negative for: breast discharge, breast lump(s), breast pain and breast self exam. Associated symptoms include abnormal vaginal bleeding. Pertinent negatives include abnormal bleeding (hematology), anxiety, decreased libido, depression, difficulty falling sleep, dyspareunia, history of infertility, nocturia, sexual dysfunction, sleep disturbances, urinary incontinence, urinary urgency, vaginal discharge and vaginal itching. Diet regular.The patient states her exercise level is  moderate, prior to her recent fall.   . The patient's tobacco use is:  Social History   Tobacco Use  Smoking Status Never   Passive exposure: Past  Smokeless Tobacco Never  . She has been exposed to passive smoke. The patient's alcohol use is:  Social History   Substance and Sexual Activity  Alcohol Use Not Currently    Review of Systems  Constitutional: Negative.   HENT: Negative.    Eyes: Negative.   Respiratory: Negative.    Cardiovascular: Negative.   Gastrointestinal: Negative.   Endocrine: Negative.   Genitourinary: Negative.   Musculoskeletal: Negative.   Skin: Negative.    Allergic/Immunologic: Negative.   Neurological: Negative.   Hematological: Negative.   Psychiatric/Behavioral: Negative.       Today's Vitals   06/01/24 1108  BP: 118/70  Pulse: 77  Temp: 98.4 F (36.9 C)  SpO2: 98%  Weight: 202 lb 12.8 oz (92 kg)  Height: 5' 3 (1.6 m)   Body mass index is 35.92 kg/m.  Wt Readings from Last 3 Encounters:  06/01/24 202 lb 12.8 oz (92 kg)  01/13/24 200 lb 12.8 oz (91.1 kg)  10/12/23 197 lb 3.2 oz (89.4 kg)     Objective:  Physical Exam Vitals and nursing note reviewed.  Constitutional:      Appearance: Normal appearance.  HENT:     Head: Normocephalic and  atraumatic.     Right Ear: Tympanic membrane, ear canal and external ear normal.     Left Ear: Tympanic membrane, ear canal and external ear normal.  Eyes:     Extraocular Movements: Extraocular movements intact.     Conjunctiva/sclera: Conjunctivae normal.     Pupils: Pupils are equal, round, and reactive to light.  Cardiovascular:     Rate and Rhythm: Normal rate and regular rhythm.     Pulses: Normal pulses.     Heart sounds: Normal heart sounds.  Pulmonary:     Effort: Pulmonary effort is normal.     Breath sounds: Normal breath sounds.  Chest:  Breasts:    Tanner Score is 5.     Right: Normal.     Left: Normal.  Abdominal:     General: Abdomen is flat. Bowel sounds are normal.     Palpations: Abdomen is soft.  Genitourinary:    Comments: deferred Musculoskeletal:        General: Normal range of motion.     Cervical back: Normal range of motion and neck supple.  Skin:    General: Skin is warm and dry.  Neurological:     General: No focal deficit present.     Mental Status: She is alert and oriented to person, place, and time.  Psychiatric:        Mood and Affect: Mood normal.        Behavior: Behavior normal.         Assessment And Plan:     Encounter for annual physical exam Assessment & Plan: A full exam was performed.  Importance of monthly self breast  exams was discussed with the patient.  She is advised to get 30-45 minutes of regular exercise, no less than four to five days per week. Both weight-bearing and aerobic exercises are recommended.  She is advised to follow a healthy diet with at least six fruits/veggies per day, decrease intake of red meat and other saturated fats and to increase fish intake to twice weekly.  Meats/fish should not be fried -- baked, boiled or broiled is preferable. It is also important to cut back on your sugar intake.  Be sure to read labels - try to avoid anything with added sugar, high fructose corn syrup or other sweeteners.  If you must use a sweetener, you can try stevia or monkfruit.  It is also important to avoid artificially sweetened foods/beverages and diet drinks. Lastly, wear SPF 50 sunscreen on exposed skin and when in direct sunlight for an extended period of time.  Be sure to avoid fast food restaurants and aim for at least 60 ounces of water  daily.       Essential hypertension, benign Assessment & Plan: Chronic, controlled. Goal BP<120/80. EKG performed, NSR w/ low voltage, LAFB, old anterior infarct and diffuse nonspecific T abnormality. She is encouraged to follow low sodium diet and aim for at least 150 minutes of exercise/week. She will continue with amlodipine  2.5mg  daily. Will recheck in 3-4 months. BP Readings from Last 3 Encounters:  06/01/24 118/70  01/13/24 138/80  10/12/23 122/80     Orders: -     CBC -     CMP14+EGFR -     Lipid panel -     POCT urinalysis dipstick -     Microalbumin / creatinine urine ratio -     EKG 12-Lead  Pure hypercholesterolemia Assessment & Plan: Chronic, she will continue with atorvastatin  M-F. She is encouraged to  continue with her heart healthy lifestyle.   Orders: -     CBC -     CMP14+EGFR -     Lipid panel -     TSH  Acrochordon Assessment & Plan: Multiple skin tags under arms, more on right side, with recent increase in number. - refer to  Derm as requested.  Orders: -     Ambulatory referral to Dermatology  Age-related osteoporosis without current pathological fracture Assessment & Plan: Osteoporosis of spine and osteopenia of femoral neck. Bone density improved. Recent fall without fractures. - Scheduled bone density scan at [facility]. - Continue with vitamin D /calcium  supplementation - Engage in weight-bearing exercises  Orders: -     DG Bone Density; Future  Mild intermittent asthma without complication Assessment & Plan: Chronic, currently stable. Encouraged to avoid known triggers.  - Unfortunately, Asmanex  is no longer covered.  - Will send rx Symbicort , she will let me know if this is effective.  Orders: -     Albuterol  Sulfate HFA; TAKE 2 PUFFS BY MOUTH EVERY 6 HOURS AS NEEDED. FOR WHEEZE OR SOB.  Dispense: 18 each; Refill: 5  Prediabetes Assessment & Plan: Previous labs reviewed, her A1c has been elevated in the past. I will check an A1c today. Reminded to avoid refined sugars including sugary drinks/foods and processed meats including bacon, sausages and deli meats.    Orders: -     Hemoglobin A1c  Elevated liver enzymes -     FIB-4 W/REFLEX TO ELF  Abnormal EKG -     ECHOCARDIOGRAM COMPLETE; Future  Obesity, morbid (HCC) Assessment & Plan: BMI 35 with concomitant HTN. She is encouraged to strive for BMI less than 30 to decrease cardiac risk. Advised to aim for at least 150 minutes of exercise per week.    Other orders -     Atorvastatin  Calcium ; Take one tab po MWF -     Budesonide -Formoterol  Fumarate; 2 puffs twice daily  Dispense: 1 each; Refill: 12   Return for 1 year physical, 6 month bp. Patient was given opportunity to ask questions. Patient verbalized understanding of the plan and was able to repeat key elements of the plan. All questions were answered to their satisfaction.   I, Catheryn LOISE Slocumb, MD, have reviewed all documentation for this visit. The documentation on 06/01/24 for the  exam, diagnosis, procedures, and orders are all accurate and complete.

## 2024-06-01 NOTE — Patient Instructions (Signed)

## 2024-06-01 NOTE — Assessment & Plan Note (Signed)

## 2024-06-02 LAB — CMP14+EGFR
ALT: 66 IU/L — ABNORMAL HIGH (ref 0–32)
AST: 32 IU/L (ref 0–40)
Albumin: 4.2 g/dL (ref 3.9–4.9)
Alkaline Phosphatase: 114 IU/L (ref 49–135)
BUN/Creatinine Ratio: 17 (ref 12–28)
BUN: 13 mg/dL (ref 8–27)
Bilirubin Total: 0.3 mg/dL (ref 0.0–1.2)
CO2: 25 mmol/L (ref 20–29)
Calcium: 9.3 mg/dL (ref 8.7–10.3)
Chloride: 102 mmol/L (ref 96–106)
Creatinine, Ser: 0.76 mg/dL (ref 0.57–1.00)
Globulin, Total: 2.8 g/dL (ref 1.5–4.5)
Glucose: 84 mg/dL (ref 70–99)
Potassium: 4.5 mmol/L (ref 3.5–5.2)
Sodium: 143 mmol/L (ref 134–144)
Total Protein: 7 g/dL (ref 6.0–8.5)
eGFR: 86 mL/min/1.73 (ref >59)

## 2024-06-02 LAB — LIPID PANEL
Chol/HDL Ratio: 2.6 ratio (ref 0.0–4.4)
Cholesterol, Total: 148 mg/dL (ref 100–199)
HDL: 58 mg/dL (ref >39)
LDL Chol Calc (NIH): 61 mg/dL (ref 0–99)
Triglycerides: 172 mg/dL — ABNORMAL HIGH (ref 0–149)
VLDL Cholesterol Cal: 29 mg/dL (ref 5–40)

## 2024-06-02 LAB — MICROALBUMIN / CREATININE URINE RATIO
Creatinine, Urine: 79.3 mg/dL
Microalb/Creat Ratio: 12 mg/g{creat} (ref 0–29)
Microalbumin, Urine: 9.3 ug/mL

## 2024-06-02 LAB — CBC
Hematocrit: 40.7 % (ref 34.0–46.6)
Hemoglobin: 13 g/dL (ref 11.1–15.9)
MCH: 28.8 pg (ref 26.6–33.0)
MCHC: 31.9 g/dL (ref 31.5–35.7)
MCV: 90 fL (ref 79–97)
Platelets: 259 x10E3/uL (ref 150–450)
RBC: 4.51 x10E6/uL (ref 3.77–5.28)
RDW: 13.6 % (ref 11.7–15.4)
WBC: 3.5 x10E3/uL (ref 3.4–10.8)

## 2024-06-02 LAB — FIB-4 W/REFLEX TO ELF
ALT: 66 IU/L — ABNORMAL HIGH (ref 0–32)
AST: 32 IU/L (ref 0–40)
FIB-4 Index: 1.02 (ref 0.00–2.67)
Platelets: 254 x10E3/uL (ref 150–450)

## 2024-06-02 LAB — TSH: TSH: 1.9 u[IU]/mL (ref 0.450–4.500)

## 2024-06-02 LAB — HEMOGLOBIN A1C
Est. average glucose Bld gHb Est-mCnc: 123 mg/dL
Hgb A1c MFr Bld: 5.9 % — ABNORMAL HIGH (ref 4.8–5.6)

## 2024-06-06 ENCOUNTER — Ambulatory Visit

## 2024-06-06 DIAGNOSIS — R7303 Prediabetes: Secondary | ICD-10-CM | POA: Insufficient documentation

## 2024-06-06 DIAGNOSIS — M810 Age-related osteoporosis without current pathological fracture: Secondary | ICD-10-CM | POA: Insufficient documentation

## 2024-06-06 DIAGNOSIS — Z0000 Encounter for general adult medical examination without abnormal findings: Secondary | ICD-10-CM

## 2024-06-06 NOTE — Assessment & Plan Note (Signed)
 BMI 35 with concomitant HTN. She is encouraged to strive for BMI less than 30 to decrease cardiac risk. Advised to aim for at least 150 minutes of exercise per week.

## 2024-06-06 NOTE — Assessment & Plan Note (Signed)
 Previous labs reviewed, her A1c has been elevated in the past. I will check an A1c today. Reminded to avoid refined sugars including sugary drinks/foods and processed meats including bacon, sausages and deli meats.

## 2024-06-06 NOTE — Assessment & Plan Note (Signed)
 Multiple skin tags under arms, more on right side, with recent increase in number. - refer to Derm as requested.

## 2024-06-06 NOTE — Assessment & Plan Note (Signed)
 Chronic, controlled. Goal BP<120/80. EKG performed, NSR w/ low voltage, LAFB, old anterior infarct and diffuse nonspecific T abnormality. She is encouraged to follow low sodium diet and aim for at least 150 minutes of exercise/week. She will continue with amlodipine  2.5mg  daily. Will recheck in 3-4 months. BP Readings from Last 3 Encounters:  06/01/24 118/70  01/13/24 138/80  10/12/23 122/80

## 2024-06-06 NOTE — Assessment & Plan Note (Addendum)
 Osteoporosis of spine and osteopenia of femoral neck. Bone density improved. Recent fall without fractures. - Scheduled bone density scan at [facility]. - Continue with vitamin D /calcium  supplementation - Engage in weight-bearing exercises

## 2024-06-06 NOTE — Assessment & Plan Note (Signed)
 Chronic, she will continue with atorvastatin M-F. She is encouraged to continue with her heart healthy lifestyle.

## 2024-06-06 NOTE — Progress Notes (Deleted)
 No chief complaint on file.    Subjective:   Maria Kline is a 68 y.o. female who presents for a Medicare Annual Wellness Visit.  Fall Screening Falls in the past year?: 1 Number of falls in past year: 0 Was there an injury with Fall?: 0 Fall Risk Category Calculator: 1 Patient Fall Risk Level: Low Fall Risk  Fall Risk Patient at Risk for Falls Due to: Impaired balance/gait Fall risk Follow up: Falls evaluation completed  Advance Directives (For Healthcare) Does Patient Have a Medical Advance Directive?: No Would patient like information on creating a medical advance directive?: No - Patient declined    Allergies (verified) Demerol [meperidine] and Penicillins   Current Medications (verified) Outpatient Encounter Medications as of 06/06/2024  Medication Sig   albuterol  (VENTOLIN  HFA) 108 (90 Base) MCG/ACT inhaler TAKE 2 PUFFS BY MOUTH EVERY 6 HOURS AS NEEDED. FOR WHEEZE OR SOB.   amLODipine  (NORVASC ) 2.5 MG tablet TAKE 1 TABLET BY MOUTH EVERY DAY   atorvastatin  (LIPITOR) 40 MG tablet Take one tab po MWF   budesonide -formoterol  (SYMBICORT ) 80-4.5 MCG/ACT inhaler 2 puffs twice daily   cetirizine (ZYRTEC) 10 MG tablet Take 10 mg by mouth as needed for allergies or rhinitis.   Cholecalciferol (VITAMIN D3) 125 MCG (5000 UT) CAPS Take by mouth.   LUMIGAN 0.01 % SOLN Place 1 drop into both eyes at bedtime.   MAGNESIUM PO Take 25 mg by mouth daily at 12 noon.   montelukast  (SINGULAIR ) 10 MG tablet TAKE 1 TABLET BY MOUTH EVERY DAY   No facility-administered encounter medications on file as of 06/06/2024.    History: Past Medical History:  Diagnosis Date   Allergy    Anemia    76yrs ago   Asthma    Cataracts, bilateral    Chicken pox    Complication of anesthesia 2000   c-section; excessive shivering   Eczema    hx of   Hx of seasonal allergies    Hyperlipidemia    Measles    Mumps    Vitamin D  deficiency    Vit D on tues and fri   Past Surgical History:   Procedure Laterality Date   adenodectomy     CAPSULOTOMY  03/10/2012   Procedure: MINOR CAPSULOTOMY;  Surgeon: Debby FORBES Sharalyn Mickey., MD;  Location: [facility] OR;  Service: Ophthalmology;  Laterality: Left;  Left Eye   CATARACT EXTRACTION W/PHACO  11/25/2011   Procedure: CATARACT EXTRACTION PHACO AND INTRAOCULAR LENS PLACEMENT (IOC);  Surgeon: Jestine Bunnell, MD;  Location: [facility] OR;  Service: Ophthalmology;  Laterality: Left;   CATARACT EXTRACTION W/PHACO  12/07/2011   Procedure: CATARACT EXTRACTION PHACO AND INTRAOCULAR LENS PLACEMENT (IOC);  Surgeon: Jestine Bunnell, MD;  Location: [facility] OR;  Service: Ophthalmology;  Laterality: Right;   cataract surgery      left   CESAREAN SECTION  2000   COLONOSCOPY     EYE SURGERY     MYOMECTOMY  1996   REFRACTIVE SURGERY Bilateral 02/2018   for glaucoma, performed by Dr. Cyrus   RETINAL DETACHMENT SURGERY Right 07/28/2023   reitnal surgery   TONSILLECTOMY AND ADENOIDECTOMY  1964   as a child   WISDOM TOOTH EXTRACTION  1970   Family History  Problem Relation Age of Onset   Hypotension Mother    Hypertension Mother    Diabetes Mother    Dementia Mother    GI problems Mother    Colon cancer Father    Cancer - Colon Father  Cancer Father    Hypertension Sister    Hypertension Paternal Grandmother    Hypertension Paternal Grandfather    Healthy Son    Diabetes Other    Stroke Other    Hypertension Other    Heart disease Other    Hypertension Maternal Grandmother    Asthma Son    Anesthesia problems Neg Hx    Malignant hyperthermia Neg Hx    Pseudochol deficiency Neg Hx    Sleep apnea Neg Hx    Social History   Occupational History   Not on file  Tobacco Use   Smoking status: Never    Passive exposure: Past   Smokeless tobacco: Never  Vaping Use   Vaping status: Never Used  Substance and Sexual Activity   Alcohol use: Not Currently   Drug use: No   Sexual activity: Yes    Birth control/protection: Pill, Post-menopausal    Tobacco Counseling Counseling given: Not Answered  SDOH Screenings   Food Insecurity: No Food Insecurity (05/30/2024)  Housing: Unknown (05/30/2024)  Transportation Needs: No Transportation Needs (05/30/2024)  Utilities: Not At Risk (06/01/2023)  Alcohol Screen: Low Risk  (05/30/2024)  Depression (PHQ2-9): Low Risk  (06/01/2024)  Financial Resource Strain: Low Risk  (05/30/2024)  Physical Activity: Sufficiently Active (05/30/2024)  Social Connections: Socially Integrated (05/30/2024)  Stress: Stress Concern Present (05/30/2024)  Tobacco Use: Low Risk  (06/01/2024)  Health Literacy: Adequate Health Literacy (06/01/2023)   See flowsheets for full screening details  Depression Screen PHQ 2 & 9 Depression Scale- Over the past 2 weeks, how often have you been bothered by any of the following problems? Little interest or pleasure in doing things: 0 Feeling down, depressed, or hopeless (PHQ Adolescent also includes...irritable): 0 PHQ-2 Total Score: 0 Trouble falling or staying asleep, or sleeping too much: 0 Feeling tired or having little energy: 0 Poor appetite or overeating (PHQ Adolescent also includes...weight loss): 0 Feeling bad about yourself - or that you are a failure or have let yourself or your family down: 0 Trouble concentrating on things, such as reading the newspaper or watching television (PHQ Adolescent also includes...like school work): 0 Moving or speaking so slowly that other people could have noticed. Or the opposite - being so fidgety or restless that you have been moving around a lot more than usual: 0 Thoughts that you would be better off dead, or of hurting yourself in some way: 0 PHQ-9 Total Score: 0 If you checked off any problems, how difficult have these problems made it for you to do your work, take care of things at home, or get along with other people?: Not difficult at all     Goals Addressed   None          Objective:    There were no vitals filed for  this visit. There is no height or weight on file to calculate BMI.  Hearing/Vision screen No results found. Immunizations and Health Maintenance Health Maintenance  Topic Date Due   Bone Density Scan  06/12/2023   COVID-19 Vaccine (7 - 2025-26 season) 02/28/2024   Medicare Annual Wellness (AWV)  05/31/2024   Colonoscopy  03/05/2025   Pneumococcal Vaccine: 50+ Years (3 of 3 - PCV20 or PCV21) 05/01/2025   Mammogram  06/08/2025   DTaP/Tdap/Td (6 - Td or Tdap) 12/05/2029   Influenza Vaccine  Completed   Hepatitis C Screening  Completed   Zoster Vaccines- Shingrix   Completed   Meningococcal B Vaccine  Aged Out  Assessment/Plan:  This is a routine wellness examination for [employer].  Patient Care Team: Jarold Medici, MD as PCP - General (Internal Medicine)  I have personally reviewed and noted the following in the patient's chart:   Medical and social history Use of alcohol, tobacco or illicit drugs  Current medications and supplements including opioid prescriptions. Functional ability and status Nutritional status Physical activity Advanced directives List of other physicians Hospitalizations, surgeries, and ER visits in previous 12 months Vitals Screenings to include cognitive, depression, and falls Referrals and appointments  No orders of the defined types were placed in this encounter.  In addition, I have reviewed and discussed with patient certain preventive protocols, quality metrics, and best practice recommendations. A written personalized care plan for preventive services as well as general preventive health recommendations were provided to patient.   Kristeen JINNY Lunger, CMA   06/06/2024   No follow-ups on file.  After Visit Summary: (MyChart) Due to this being a telephonic visit, the after visit summary with patients personalized plan was offered to patient via MyChart   Nurse Notes:

## 2024-06-06 NOTE — Assessment & Plan Note (Signed)
 Chronic, currently stable. Encouraged to avoid known triggers.  - Unfortunately, Asmanex  is no longer covered.  - Will send rx Symbicort , she will let me know if this is effective.

## 2024-06-08 DIAGNOSIS — Z1231 Encounter for screening mammogram for malignant neoplasm of breast: Secondary | ICD-10-CM | POA: Diagnosis not present

## 2024-06-09 ENCOUNTER — Encounter (HOSPITAL_BASED_OUTPATIENT_CLINIC_OR_DEPARTMENT_OTHER): Payer: Self-pay

## 2024-06-10 DIAGNOSIS — M8580 Other specified disorders of bone density and structure, unspecified site: Secondary | ICD-10-CM | POA: Diagnosis not present

## 2024-06-10 LAB — HM DEXA SCAN

## 2024-06-12 ENCOUNTER — Encounter (HOSPITAL_BASED_OUTPATIENT_CLINIC_OR_DEPARTMENT_OTHER): Payer: Self-pay

## 2024-06-13 ENCOUNTER — Other Ambulatory Visit (INDEPENDENT_AMBULATORY_CARE_PROVIDER_SITE_OTHER)

## 2024-06-13 ENCOUNTER — Encounter: Payer: Self-pay | Admitting: Internal Medicine

## 2024-06-13 DIAGNOSIS — R9431 Abnormal electrocardiogram [ECG] [EKG]: Secondary | ICD-10-CM

## 2024-06-13 LAB — ECHOCARDIOGRAM COMPLETE
Area-P 1/2: 3.31 cm2
S' Lateral: 2.66 cm

## 2024-06-14 ENCOUNTER — Ambulatory Visit: Payer: Self-pay

## 2024-06-15 ENCOUNTER — Ambulatory Visit: Payer: Self-pay | Admitting: Internal Medicine

## 2024-06-23 ENCOUNTER — Ambulatory Visit: Payer: Self-pay | Admitting: Internal Medicine

## 2024-07-13 ENCOUNTER — Other Ambulatory Visit: Payer: Self-pay

## 2024-07-13 DIAGNOSIS — D472 Monoclonal gammopathy: Secondary | ICD-10-CM

## 2024-07-14 ENCOUNTER — Inpatient Hospital Stay: Attending: Nurse Practitioner

## 2024-07-14 DIAGNOSIS — D472 Monoclonal gammopathy: Secondary | ICD-10-CM

## 2024-07-14 LAB — CMP (CANCER CENTER ONLY)
ALT: 80 U/L — ABNORMAL HIGH (ref 0–44)
AST: 41 U/L (ref 15–41)
Albumin: 4.3 g/dL (ref 3.5–5.0)
Alkaline Phosphatase: 102 U/L (ref 38–126)
Anion gap: 9 (ref 5–15)
BUN: 16 mg/dL (ref 8–23)
CO2: 28 mmol/L (ref 22–32)
Calcium: 9.6 mg/dL (ref 8.9–10.3)
Chloride: 104 mmol/L (ref 98–111)
Creatinine: 0.78 mg/dL (ref 0.44–1.00)
GFR, Estimated: >60 mL/min
Glucose, Bld: 86 mg/dL (ref 70–99)
Potassium: 4.3 mmol/L (ref 3.5–5.1)
Sodium: 140 mmol/L (ref 135–145)
Total Bilirubin: 0.4 mg/dL (ref 0.0–1.2)
Total Protein: 7.6 g/dL (ref 6.5–8.1)

## 2024-07-14 LAB — CBC WITH DIFFERENTIAL (CANCER CENTER ONLY)
Abs Immature Granulocytes: 0.01 K/uL (ref 0.00–0.07)
Basophils Absolute: 0.1 K/uL (ref 0.0–0.1)
Basophils Relative: 1 %
Eosinophils Absolute: 0.1 K/uL (ref 0.0–0.5)
Eosinophils Relative: 2 %
HCT: 39.8 % (ref 36.0–46.0)
Hemoglobin: 13 g/dL (ref 12.0–15.0)
Immature Granulocytes: 0 %
Lymphocytes Relative: 38 %
Lymphs Abs: 1.6 K/uL (ref 0.7–4.0)
MCH: 28.6 pg (ref 26.0–34.0)
MCHC: 32.7 g/dL (ref 30.0–36.0)
MCV: 87.5 fL (ref 80.0–100.0)
Monocytes Absolute: 0.4 K/uL (ref 0.1–1.0)
Monocytes Relative: 9 %
Neutro Abs: 2.1 K/uL (ref 1.7–7.7)
Neutrophils Relative %: 50 %
Platelet Count: 247 K/uL (ref 150–400)
RBC: 4.55 MIL/uL (ref 3.87–5.11)
RDW: 13.6 % (ref 11.5–15.5)
WBC Count: 4.2 K/uL (ref 4.0–10.5)
nRBC: 0 % (ref 0.0–0.2)

## 2024-07-14 NOTE — Patient Instructions (Signed)
 I connected with  Maria Kline on 07/14/24 by a audio enabled telemedicine application and verified that I am speaking with the correct person using two identifiers.  Patient Location: Home  Provider Location: Office/Clinic  Persons Participating in Visit: Patient.  I discussed the limitations of evaluation and management by telemedicine. The patient expressed understanding and agreed to proceed.   Vital Signs: Because this visit was a virtual/telehealth visit, some criteria may be missing or patient reported. Any vitals not documented were not able to be obtained and vitals that have been documented are patient reported.

## 2024-07-17 ENCOUNTER — Other Ambulatory Visit

## 2024-07-17 LAB — MULTIPLE MYELOMA PANEL, SERUM
Albumin SerPl Elph-Mcnc: 3.4 g/dL (ref 2.9–4.4)
Albumin/Glob SerPl: 1 (ref 0.7–1.7)
Alpha 1: 0.2 g/dL (ref 0.0–0.4)
Alpha2 Glob SerPl Elph-Mcnc: 0.7 g/dL (ref 0.4–1.0)
B-Globulin SerPl Elph-Mcnc: 1.2 g/dL (ref 0.7–1.3)
Gamma Glob SerPl Elph-Mcnc: 1.4 g/dL (ref 0.4–1.8)
Globulin, Total: 3.5 g/dL (ref 2.2–3.9)
IgA: 193 mg/dL (ref 87–352)
IgG (Immunoglobin G), Serum: 1227 mg/dL (ref 586–1602)
IgM (Immunoglobulin M), Srm: 182 mg/dL (ref 26–217)
Total Protein ELP: 6.9 g/dL (ref 6.0–8.5)

## 2024-07-17 LAB — KAPPA/LAMBDA LIGHT CHAINS
Kappa free light chain: 13.5 mg/L (ref 3.3–19.4)
Kappa, lambda light chain ratio: 1.05 (ref 0.26–1.65)
Lambda free light chains: 12.9 mg/L (ref 5.7–26.3)

## 2024-07-24 ENCOUNTER — Encounter: Payer: Self-pay | Admitting: Hematology

## 2024-08-02 NOTE — Progress Notes (Signed)
 "  Chief Complaint  Patient presents with   Medicare Wellness     Subjective:   Maria Kline is a 69 y.o. female who presents for a Medicare Annual Wellness Visit.  Visit info / Clinical Intake: Medicare Wellness Visit Type:: Subsequent Annual Wellness Visit Persons participating in visit and providing information:: patient Medicare Wellness Visit Mode:: Telephone If telephone:: video declined Since this visit was completed virtually, some vitals may be partially provided or unavailable. Missing vitals are due to the limitations of the virtual format.: Unable to obtain vitals - no equipment If Telephone or Video please confirm:: I connected with patient using audio/video enable telemedicine. I verified patient identity with two identifiers, discussed telehealth limitations, and patient agreed to proceed. Patient Location:: Hospital w/ husband Provider Location:: office Interpreter Needed?: No Pre-visit prep was completed: no AWV questionnaire completed by patient prior to visit?: no Living arrangements:: lives with spouse/significant other Patient's Overall Health Status Rating: good Typical amount of pain: some Does pain affect daily life?: no Are you currently prescribed opioids?: no  Dietary Habits and Nutritional Risks How many meals a day?: 3 Eats fruit and vegetables daily?: yes Most meals are obtained by: preparing own meals In the last 2 weeks, have you had any of the following?: none Diabetic:: no  Functional Status Activities of Daily Living (to include ambulation/medication): Independent Ambulation: Independent Medication Administration: Independent Home Management (perform basic housework or laundry): Independent Manage your own finances?: yes Primary transportation is: driving Concerns about vision?: no *vision screening is required for WTM* Concerns about hearing?: no  Fall Screening Falls in the past year?: 1 Number of falls in past year: 0 Was  there an injury with Fall?: 0 Fall Risk Category Calculator: 1 Patient Fall Risk Level: Low Fall Risk  Fall Risk Patient at Risk for Falls Due to: Impaired balance/gait Fall risk Follow up: Falls evaluation completed  Home and Transportation Safety: All rugs have non-skid backing?: (!) no All stairs or steps have railings?: (!) no Grab bars in the bathtub or shower?: (!) no Have non-skid surface in bathtub or shower?: yes Good home lighting?: yes Regular seat belt use?: yes Hospital stays in the last year:: no  Cognitive Assessment Difficulty concentrating, remembering, or making decisions? : no Will 6CIT or Mini Cog be Completed: yes 6CIT or Mini Cog Declined: patient alert, oriented, able to answer questions appropriately and recall recent events What year is it?: 0 points What month is it?: 0 points Give patient an address phrase to remember (5 components): [address], [place] About what time is it?: 0 points Count backwards from 20 to 1: 0 points Say the months of the year in reverse: 0 points Repeat the address phrase from earlier: 0 points 6 CIT Score: 0 points  Advance Directives (For Healthcare) Does Patient Have a Medical Advance Directive?: Yes Does patient want to make changes to medical advance directive?: Yes (ED - send information to MyChart) Would patient like information on creating a medical advance directive?: No - Patient declined  Reviewed/Updated  Reviewed/Updated: Reviewed All (Medical, Surgical, Family, Medications, Allergies, Care Teams, Patient Goals)    Allergies (verified) Demerol [meperidine] and Penicillins   Current Medications (verified) Outpatient Encounter Medications as of 06/06/2024  Medication Sig   albuterol  (VENTOLIN  HFA) 108 (90 Base) MCG/ACT inhaler TAKE 2 PUFFS BY MOUTH EVERY 6 HOURS AS NEEDED. FOR WHEEZE OR SOB.   amLODipine  (NORVASC ) 2.5 MG tablet TAKE 1 TABLET BY MOUTH EVERY DAY  atorvastatin  (LIPITOR) 40 MG tablet  Take one tab po MWF   budesonide -formoterol  (SYMBICORT ) 80-4.5 MCG/ACT inhaler 2 puffs twice daily   cetirizine (ZYRTEC) 10 MG tablet Take 10 mg by mouth as needed for allergies or rhinitis.   Cholecalciferol (VITAMIN D3) 125 MCG (5000 UT) CAPS Take by mouth.   LUMIGAN 0.01 % SOLN Place 1 drop into both eyes at bedtime.   MAGNESIUM PO Take 25 mg by mouth daily at 12 noon.   montelukast  (SINGULAIR ) 10 MG tablet TAKE 1 TABLET BY MOUTH EVERY DAY   No facility-administered encounter medications on file as of 06/06/2024.    History: Past Medical History:  Diagnosis Date   Allergy    Anemia    24yrs ago   Asthma    Cataracts, bilateral    Chicken pox    Complication of anesthesia 2000   c-section; excessive shivering   Eczema    hx of   Hx of seasonal allergies    Hyperlipidemia    Measles    Mumps    Sleep apnea    Vitamin D  deficiency    Vit D on tues and fri   Past Surgical History:  Procedure Laterality Date   adenodectomy     CAPSULOTOMY  03/10/2012   Procedure: MINOR CAPSULOTOMY;  Surgeon: Debby FORBES Sharalyn Mickey., MD;  Location: [facility] OR;  Service: Ophthalmology;  Laterality: Left;  Left Eye   CATARACT EXTRACTION W/PHACO  11/25/2011   Procedure: CATARACT EXTRACTION PHACO AND INTRAOCULAR LENS PLACEMENT (IOC);  Surgeon: Jestine Bunnell, MD;  Location: [facility] OR;  Service: Ophthalmology;  Laterality: Left;   CATARACT EXTRACTION W/PHACO  12/07/2011   Procedure: CATARACT EXTRACTION PHACO AND INTRAOCULAR LENS PLACEMENT (IOC);  Surgeon: Jestine Bunnell, MD;  Location: [facility] OR;  Service: Ophthalmology;  Laterality: Right;   cataract surgery      left   CESAREAN SECTION  2000   COLONOSCOPY     EYE SURGERY     MYOMECTOMY  1996   REFRACTIVE SURGERY Bilateral 02/2018   for glaucoma, performed by Dr. Cyrus   RETINAL DETACHMENT SURGERY Right 07/28/2023   reitnal surgery   TONSILLECTOMY AND ADENOIDECTOMY  1964   as a child   Kline TOOTH EXTRACTION  1970   Family History  Problem Relation  Age of Onset   Hypotension Mother    Hypertension Mother    Diabetes Mother    Dementia Mother    GI problems Mother    Colon cancer Father    Cancer - Colon Father    Cancer Father    Hypertension Sister    Hypertension Paternal Grandmother    Hypertension Paternal Grandfather    Healthy Son    Diabetes Other    Stroke Other    Hypertension Other    Heart disease Other    Hypertension Maternal Grandmother    Asthma Son    Anesthesia problems Neg Hx    Malignant hyperthermia Neg Hx    Pseudochol deficiency Neg Hx    Sleep apnea Neg Hx    Social History   Occupational History   Not on file  Tobacco Use   Smoking status: Never    Passive exposure: Past   Smokeless tobacco: Never  Vaping Use   Vaping status: Never Used  Substance and Sexual Activity   Alcohol use: Not Currently   Drug use: No   Sexual activity: Yes    Birth control/protection: Pill, Post-menopausal   Tobacco Counseling Counseling given: Not Answered  SDOH  Screenings   Food Insecurity: No Food Insecurity (06/06/2024)  Housing: Low Risk (06/06/2024)  Transportation Needs: No Transportation Needs (06/06/2024)  Utilities: Not At Risk (06/06/2024)  Alcohol Screen: Low Risk (06/06/2024)  Depression (PHQ2-9): Low Risk (06/06/2024)  Financial Resource Strain: Low Risk (06/06/2024)  Physical Activity: Sufficiently Active (06/06/2024)  Social Connections: Socially Integrated (06/06/2024)  Stress: Stress Concern Present (06/06/2024)  Tobacco Use: Low Risk (06/06/2024)  Health Literacy: Adequate Health Literacy (06/06/2024)   See flowsheets for full screening details  Depression Screen PHQ 2 & 9 Depression Scale- Over the past 2 weeks, how often have you been bothered by any of the following problems? Little interest or pleasure in doing things: 0 Feeling down, depressed, or hopeless (PHQ Adolescent also includes...irritable): 0 PHQ-2 Total Score: 0 Trouble falling or staying asleep, or sleeping too much:  0 Feeling tired or having little energy: 0 Poor appetite or overeating (PHQ Adolescent also includes...weight loss): 0 Feeling bad about yourself - or that you are a failure or have let yourself or your family down: 0 Trouble concentrating on things, such as reading the newspaper or watching television (PHQ Adolescent also includes...like school work): 0 Moving or speaking so slowly that other people could have noticed. Or the opposite - being so fidgety or restless that you have been moving around a lot more than usual: 0 Thoughts that you would be better off dead, or of hurting yourself in some way: 0 PHQ-9 Total Score: 0 If you checked off any problems, how difficult have these problems made it for you to do your work, take care of things at home, or get along with other people?: Not difficult at all     Goals Addressed               This Visit's Progress     Patient Stated (pt-stated)        Less stress and lose weight that she has gained.              Objective:    Today's Vitals   There is no height or weight on file to calculate BMI.  Hearing/Vision screen No results found. Immunizations and Health Maintenance Health Maintenance  Topic Date Due   COVID-19 Vaccine (7 - 2025-26 season) 02/28/2024   Colonoscopy  03/05/2025   Pneumococcal Vaccine: 50+ Years (3 of 3 - PCV20 or PCV21) 05/01/2025   Medicare Annual Wellness (AWV)  06/06/2025   Mammogram  06/08/2025   Bone Density Scan  06/10/2026   DTaP/Tdap/Td (6 - Td or Tdap) 12/05/2029   Influenza Vaccine  Completed   Hepatitis C Screening  Completed   Zoster Vaccines- Shingrix   Completed   Meningococcal B Vaccine  Aged Out        Assessment/Plan:  This is a routine wellness examination for [employer].  Patient Care Team: Jarold Medici, MD as PCP - General (Internal Medicine)  I have personally reviewed and noted the following in the patients chart:   Medical and social history Use of alcohol, tobacco or  illicit drugs  Current medications and supplements including opioid prescriptions. Functional ability and status Nutritional status Physical activity Advanced directives List of other physicians Hospitalizations, surgeries, and ER visits in previous 12 months Vitals Screenings to include cognitive, depression, and falls Referrals and appointments  No orders of the defined types were placed in this encounter.  In addition, I have reviewed and discussed with patient certain preventive protocols, quality metrics, and best practice recommendations. A written personalized care  plan for preventive services as well as general preventive health recommendations were provided to patient.   Kristeen JINNY Lunger, CMA   06/01/2024   Return in 1 year (on 06/06/2025).  After Visit Summary: (MyChart) Due to this being a telephonic visit, the after visit summary with patients personalized plan was offered to patient via MyChart   Nurse Notes:  "

## 2024-08-02 NOTE — Addendum Note (Signed)
 Addended by: GLADIS KRISTEEN PARAS on: 08/02/2024 01:13 PM   Modules accepted: Orders

## 2024-11-30 ENCOUNTER — Ambulatory Visit: Admitting: Internal Medicine

## 2024-12-04 ENCOUNTER — Ambulatory Visit: Payer: Medicare HMO | Admitting: Rheumatology

## 2024-12-21 ENCOUNTER — Telehealth: Admitting: Adult Health

## 2025-01-12 ENCOUNTER — Ambulatory Visit: Admitting: Nurse Practitioner

## 2025-01-12 ENCOUNTER — Other Ambulatory Visit

## 2025-06-20 ENCOUNTER — Ambulatory Visit
